# Patient Record
Sex: Female | Born: 1975 | Race: White | Hispanic: No | Marital: Married | Smoking: Current some day smoker
Health system: Southern US, Community
[De-identification: ages and names within clinical notes are randomized; demographics above are authoritative.]

## PROBLEM LIST (undated history)

## (undated) ENCOUNTER — Emergency Department: Admission: EM | Payer: No Typology Code available for payment source

## (undated) DIAGNOSIS — B1920 Unspecified viral hepatitis C without hepatic coma: Secondary | ICD-10-CM

## (undated) DIAGNOSIS — D649 Anemia, unspecified: Secondary | ICD-10-CM

## (undated) DIAGNOSIS — F419 Anxiety disorder, unspecified: Secondary | ICD-10-CM

## (undated) HISTORY — DX: Unspecified viral hepatitis C without hepatic coma: B19.20

## (undated) HISTORY — PX: HYSTERECTOMY ABDOMINAL WITH SALPINGECTOMY: SHX6725

---
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
## 2003-06-24 ENCOUNTER — Other Ambulatory Visit: Payer: Self-pay

## 2004-03-14 ENCOUNTER — Emergency Department: Payer: Self-pay | Admitting: Emergency Medicine

## 2004-03-14 ENCOUNTER — Other Ambulatory Visit: Payer: Self-pay

## 2004-09-25 ENCOUNTER — Observation Stay: Payer: Self-pay | Admitting: Unknown Physician Specialty

## 2004-10-09 ENCOUNTER — Observation Stay: Payer: Self-pay | Admitting: Obstetrics and Gynecology

## 2004-10-10 ENCOUNTER — Observation Stay: Payer: Self-pay

## 2004-10-29 ENCOUNTER — Observation Stay: Payer: Self-pay

## 2004-11-07 ENCOUNTER — Observation Stay: Payer: Self-pay | Admitting: Unknown Physician Specialty

## 2004-11-19 ENCOUNTER — Inpatient Hospital Stay: Payer: Self-pay

## 2005-05-14 ENCOUNTER — Emergency Department: Payer: Self-pay | Admitting: Unknown Physician Specialty

## 2008-08-03 ENCOUNTER — Emergency Department: Payer: Self-pay | Admitting: Emergency Medicine

## 2008-08-10 ENCOUNTER — Ambulatory Visit: Payer: Self-pay | Admitting: Surgery

## 2008-08-19 ENCOUNTER — Ambulatory Visit: Payer: Self-pay | Admitting: Surgery

## 2010-07-11 IMAGING — CT CT ABD-PELV W/O CM
1 of 2 series · 15 of 32 positions shown, 19 images · non-contrast
Comparison: No comparison

REASON FOR EXAM: (1) pain and constipation; (2) pain and constipation
COMMENTS:

PROCEDURE:     CT  - CT ABDOMEN AND PELVIS W[DATE]  [DATE]
RESULT:     Indication: Pain
TECHNIQUE: Multiple axial images from the lung bases to the symphysis pubis
were obtained without oral or intravenous contrast.

[Series 2: abd/pelvis · axial · 0.70mm/px · z∈[-976,-601]mm · 15 of 142 slices shown, 19 images]
[im 11/142  soft-tissue]
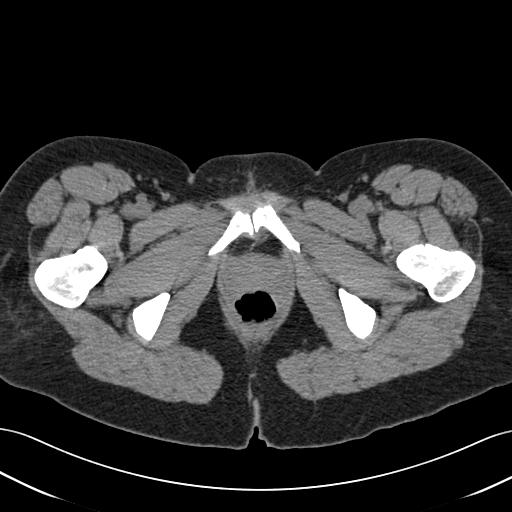
[im 11/142  bone]
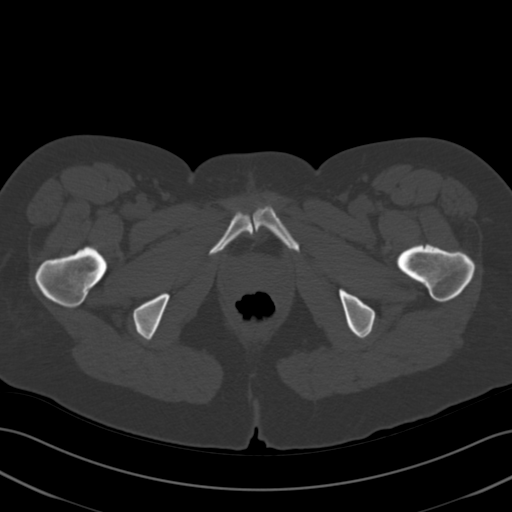
[im 21/142  soft-tissue]
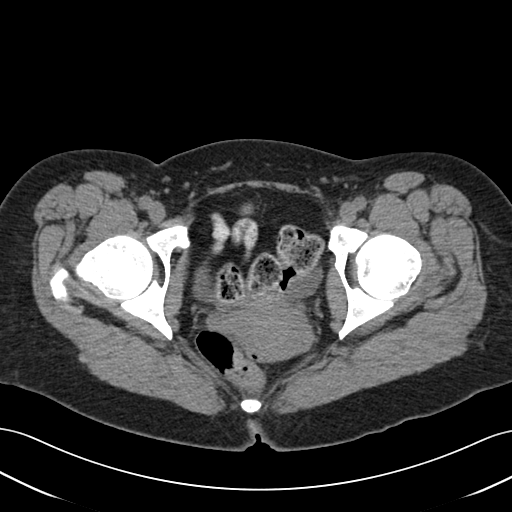
[im 32/142  soft-tissue]
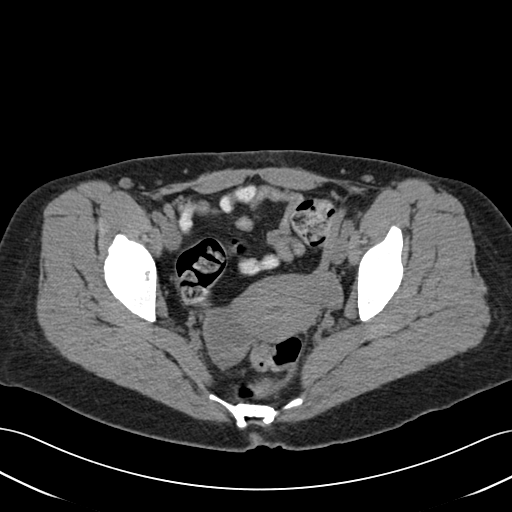
[im 42/142  soft-tissue]
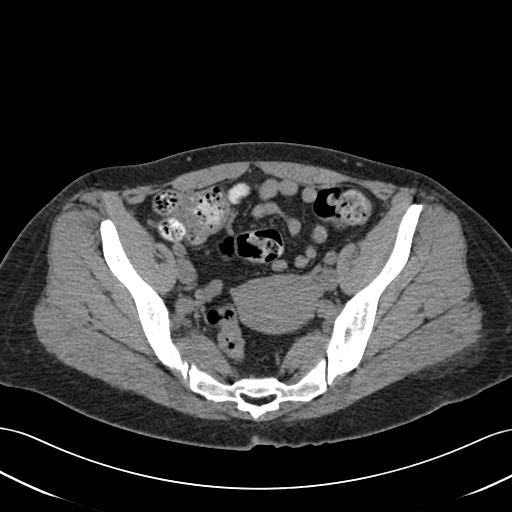
[im 53/142  soft-tissue]
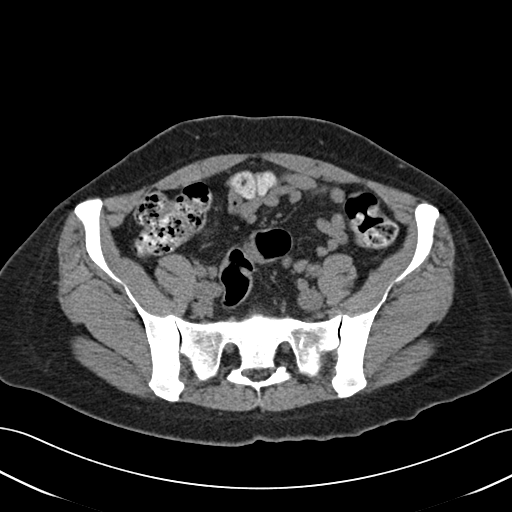
[im 63/142  soft-tissue]
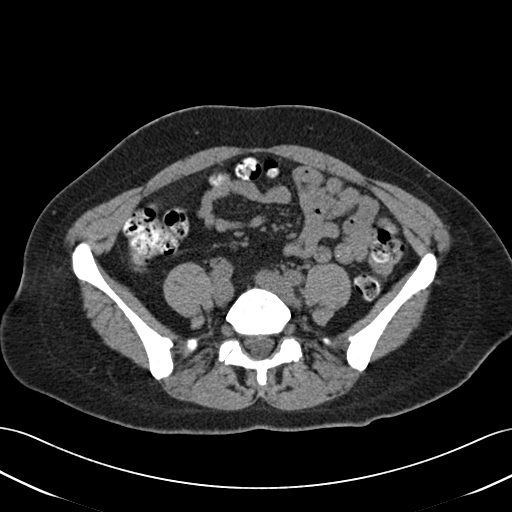
[im 74/142  soft-tissue]
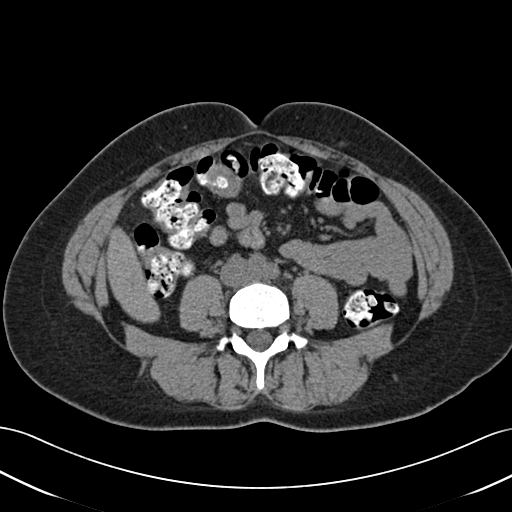
[im 84/142  soft-tissue]
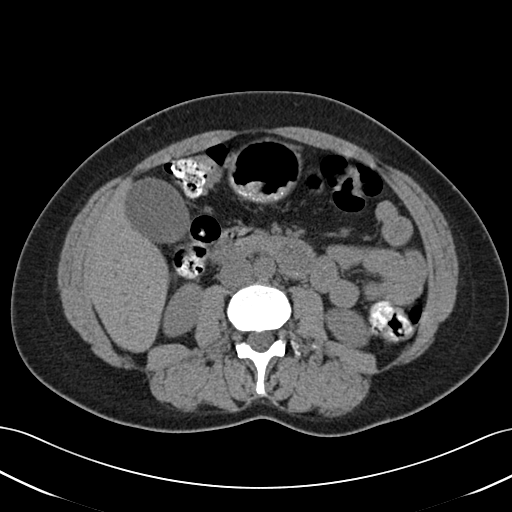
[im 95/142  soft-tissue]
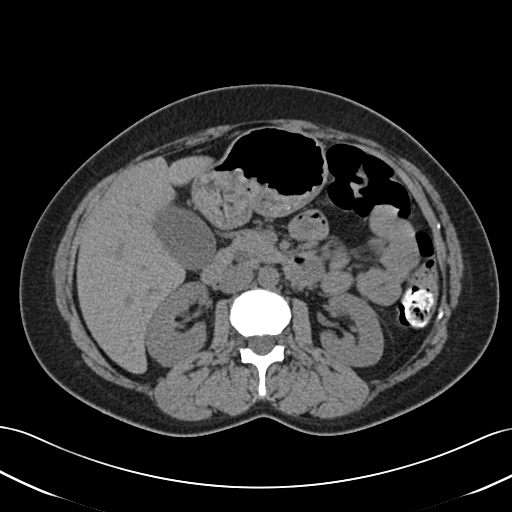
[im 95/142  bone]
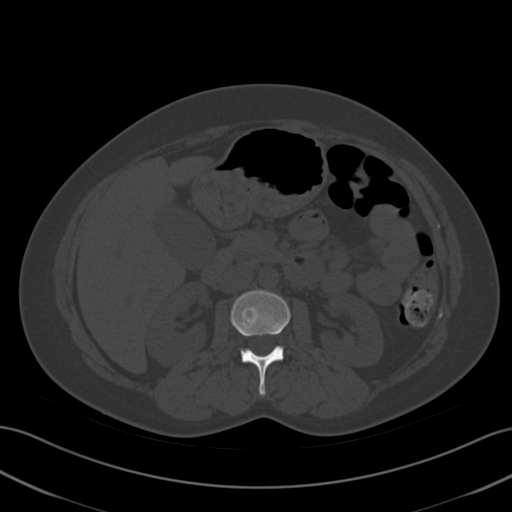
[im 105/142  soft-tissue]
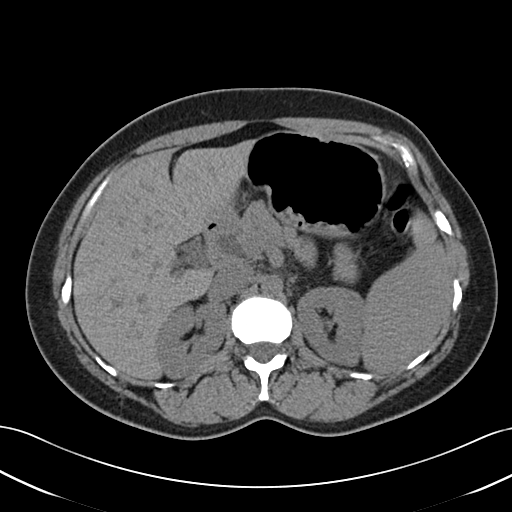
[im 115/142  soft-tissue]
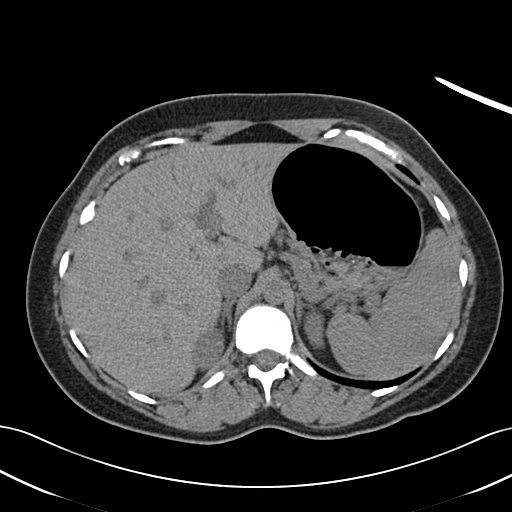
[im 121/142  lung]
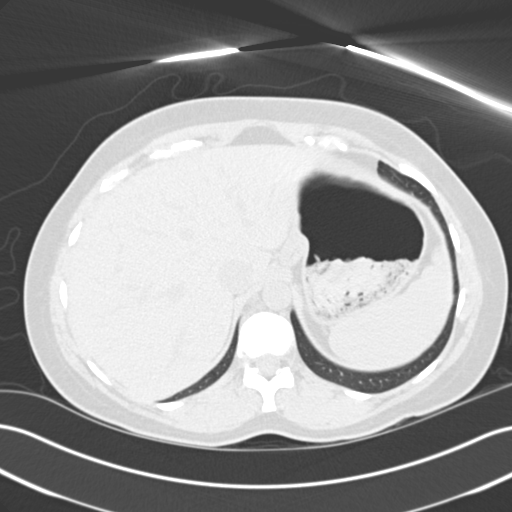
[im 126/142  soft-tissue]
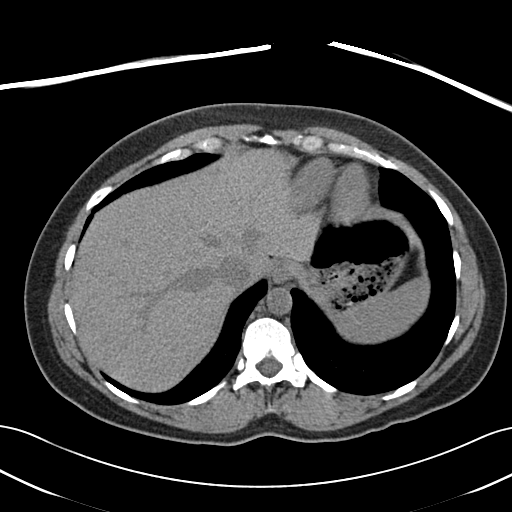
[im 126/142  lung]
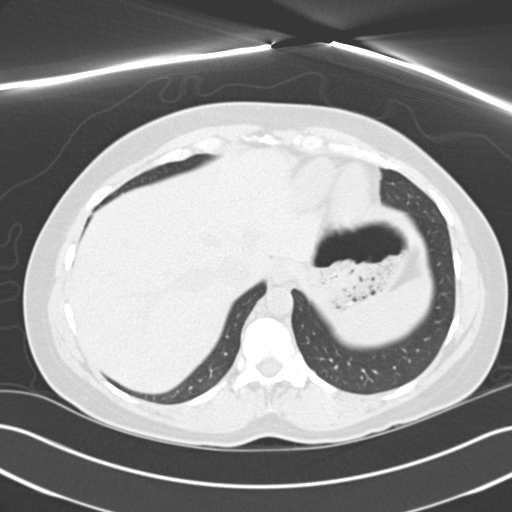
[im 131/142  lung]
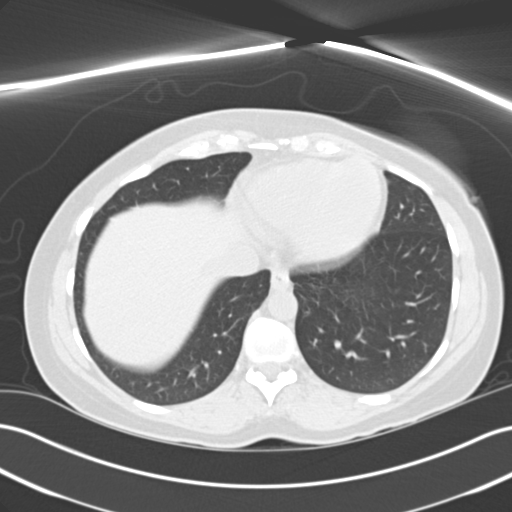
[im 136/142  soft-tissue]
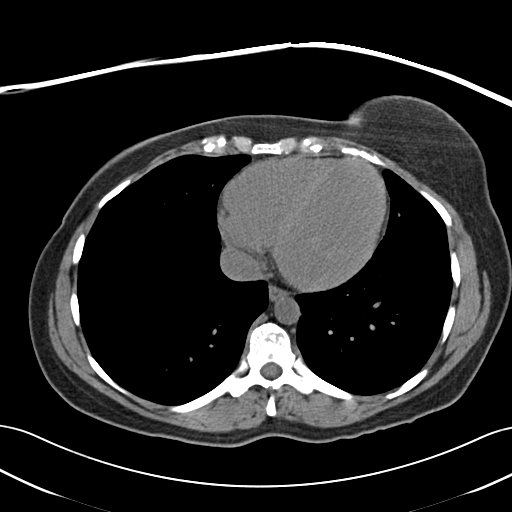
[im 136/142  lung]
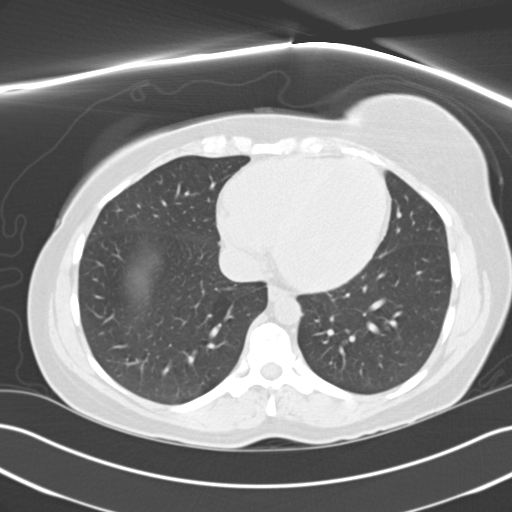

[15 of 32 positions shown; findings below may reference images not displayed]

FINDINGS: The lung bases are clear. There is no pleural or pericardial effusions.

No renal, ureteral, or bladder calculi. No obstructive uropathy. No
perinephric stranding is seen. The kidneys are symmetric in size without
evidence for exophytic mass. The bladder is unremarkable.

The liver demonstrates no focal abnormality. The gallbladder is distended
with possible tiny cholelithiasis present within the gallbladder. There is
mild dilatation of the central intrahepatic biliary ducts. The proximal
common bile duct is mildly dilated measuring 8 mm in greatest diameter. The
spleen demonstrates no focal abnormality. The adrenal glands and pancreas
are normal.

The unopacified stomach, duodenum, small intestine, and large intestine are
unremarkable, but evaluation is limited by lack of oral contrast. There is
no pneumoperitoneum, pneumatosis, or portal venous gas. There is no
abdominal or pelvic free fluid. There is no lymphadenopathy. There is a
nonspecific right ovarian 3.4 cm cystic structure. The left ovary is grossly
unremarkable.

The abdominal aorta is normal in caliber.

The osseous structures are unremarkable.
IMPRESSION: 1. No urolithiasis or obstructive uropathy.

2. There is a nonspecific right ovarian 3.4 cm cystic structure. A followup
pelvic ultrasound is recommended for further characterization.

3. The gallbladder is distended with possible tiny cholelithiasis present
within the gallbladder. There is mild dilatation of the central intrahepatic
biliary ducts. The proximal common bile duct is mildly dilated measuring 8
mm in greatest diameter. If there is further focal concern regarding acute
cholecystitis recommend further evaluation with a right upper quadrant
ultrasound.

## 2010-07-18 IMAGING — US ABDOMEN ULTRASOUND
1 series · 17 of 25 positions shown · non-contrast
Comparison: none

REASON FOR EXAM: RUQ pain
COMMENTS:

[Series 1: abdomen ultrasound · 17 of 83 slices shown]
[im 1/83]
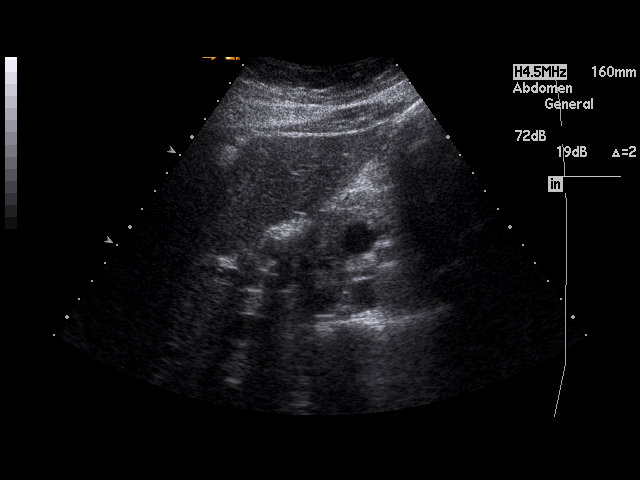
[im 7/83]
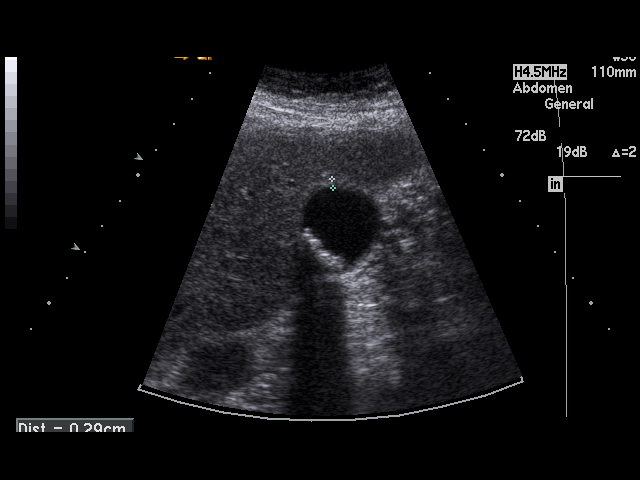
[im 11/83]
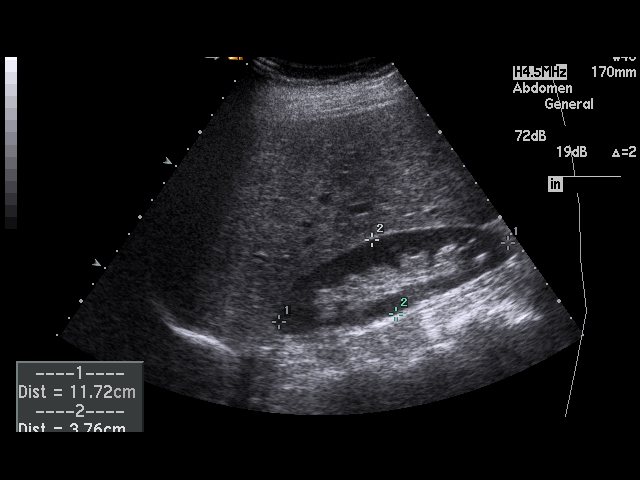
[im 18/83]
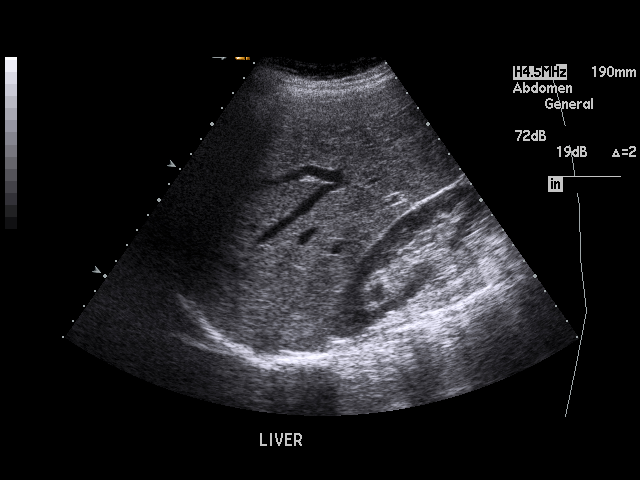
[im 21/83]
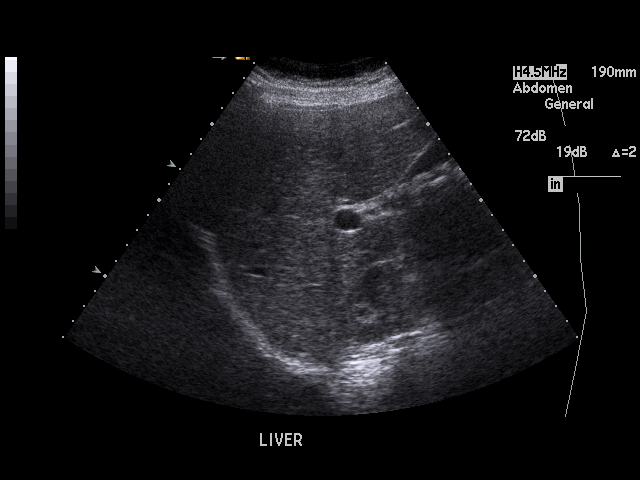
[im 28/83]
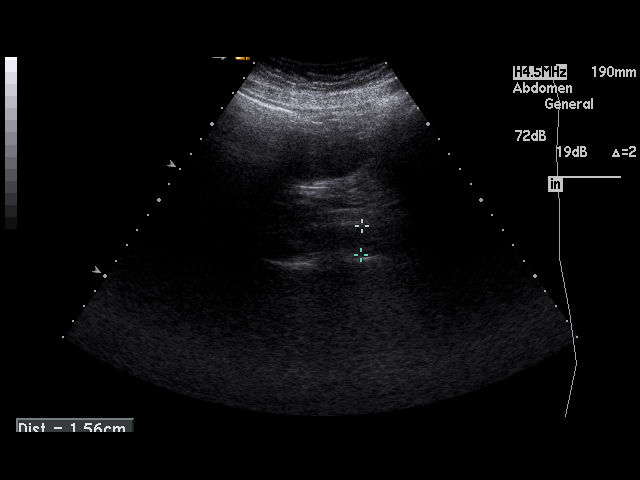
[im 31/83]
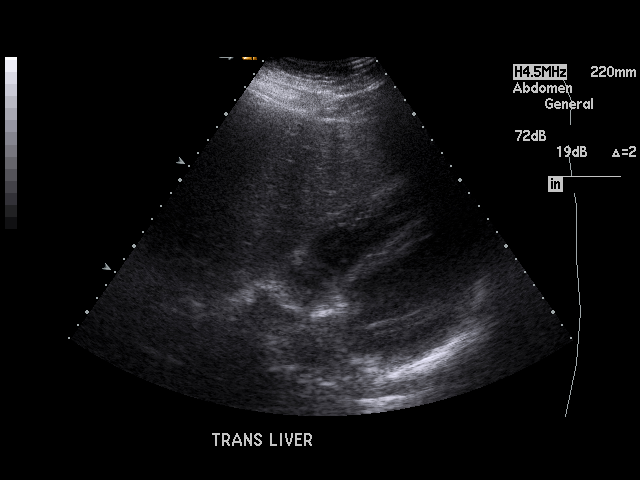
[im 38/83]
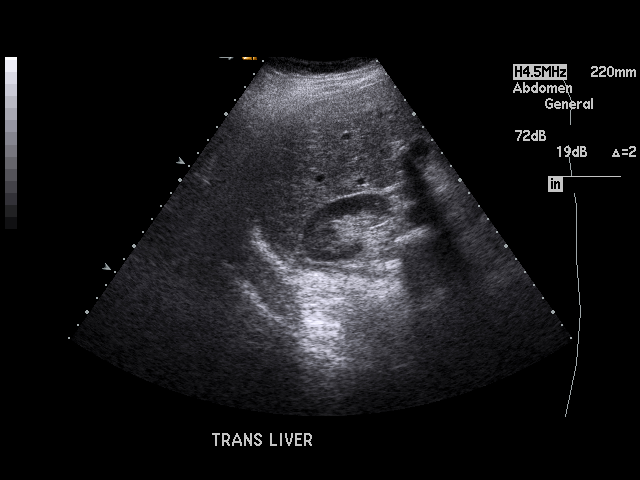
[im 42/83]
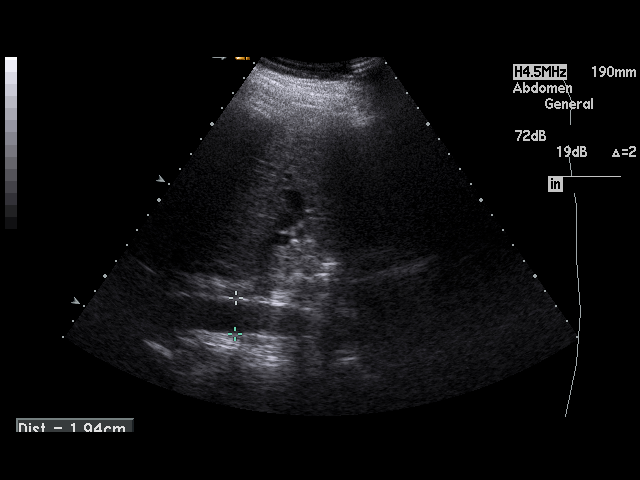
[im 45/83]
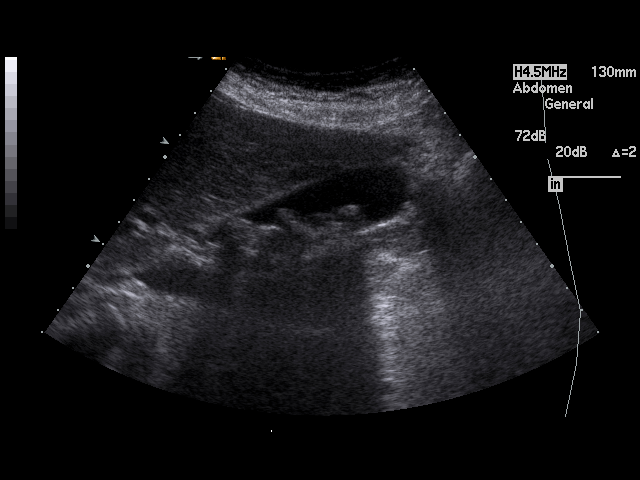
[im 52/83]
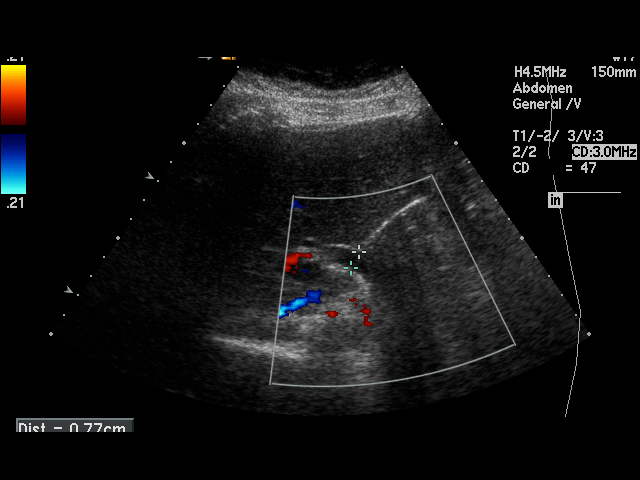
[im 55/83]
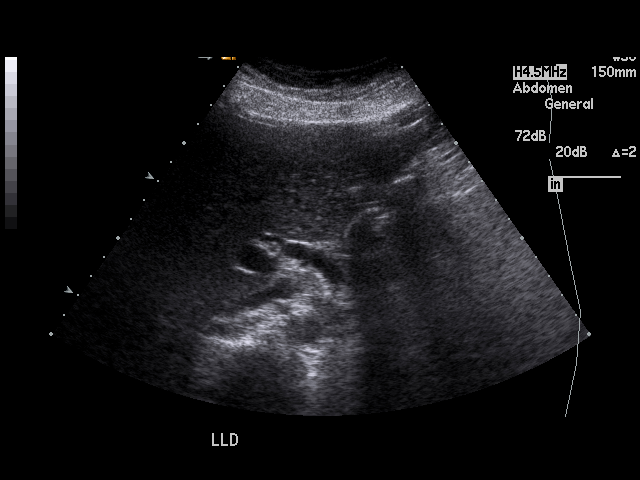
[im 62/83]
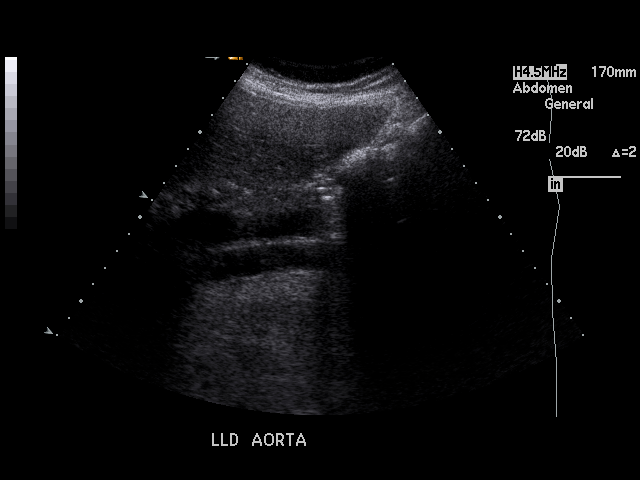
[im 65/83]
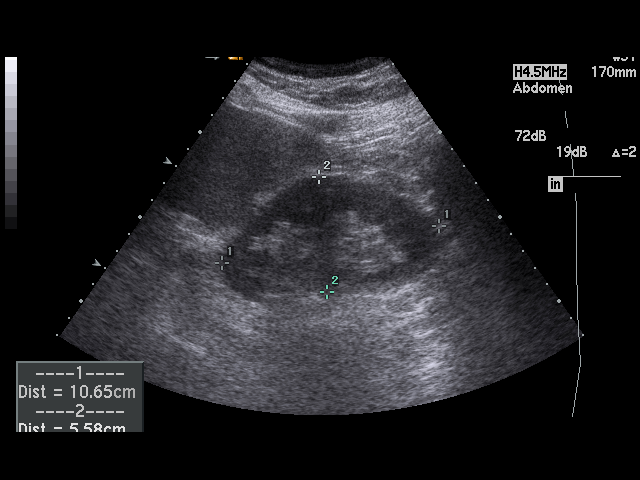
[im 72/83]
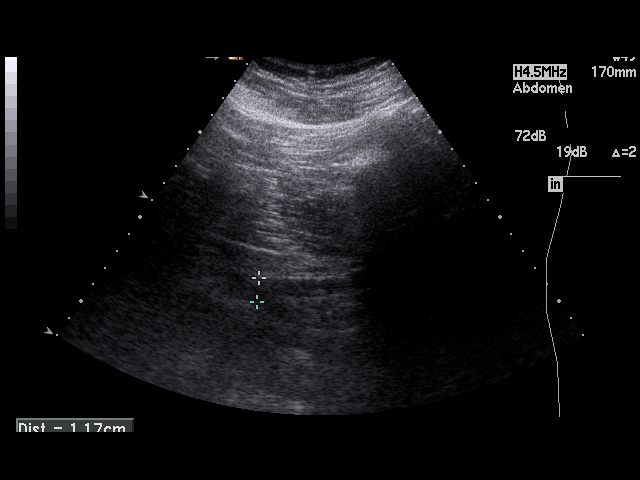
[im 76/83]
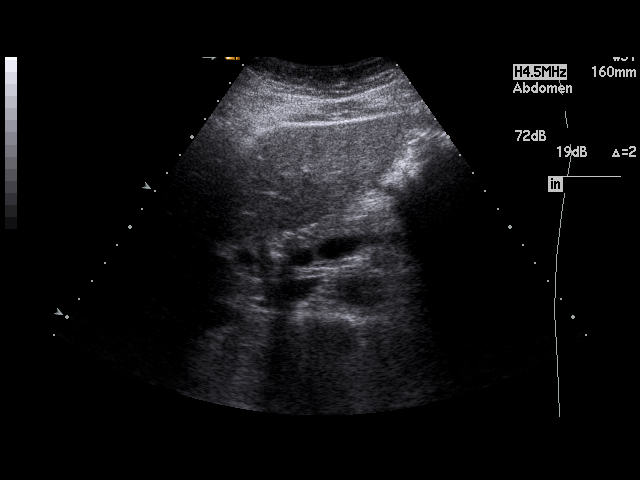
[im 83/83]
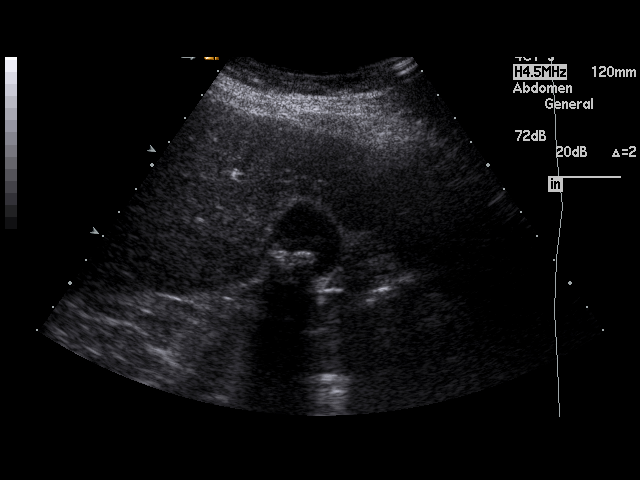

[17 of 25 positions shown; findings below may reference images not displayed]

PROCEDURE:     US  - US ABDOMEN GENERAL SURVEY  - August 10, 2008  [DATE]

RESULT:     The liver, spleen, abdominal aorta and inferior vena cava show
no significant abnormalities. The head and body of the pancreas are normal
in appearance. The pancreatic tail is obscured by bowel gas. There are noted
multiple mobile echodensities in the gallbladder compatible with gallstones.
The gallbladder wall thickness measures 2.9 mm which is upper limits for
normal. The common bile duct measures 8 mm which also is upper limits for
normal or possibly mildly distended for a patient of this age. The
gallbladder could the further evaluated by biliary nuclide scan if
clinically indicated. The kidneys show no hydronephrosis. There is no
ascites.
IMPRESSION: 1. Cholelithiasis.
2. The common bile duct measures 8 mm in diameter which is somewhat
prominent for a patient of this age. The biliary system could be further
evaluated by biliary nuclide scan if clinically indicated.

## 2010-09-26 ENCOUNTER — Emergency Department: Payer: Self-pay

## 2011-01-03 ENCOUNTER — Ambulatory Visit: Payer: Self-pay | Admitting: Internal Medicine

## 2011-01-05 ENCOUNTER — Ambulatory Visit: Payer: Self-pay | Admitting: Internal Medicine

## 2011-01-05 ENCOUNTER — Ambulatory Visit: Payer: Self-pay | Admitting: Oncology

## 2011-02-07 ENCOUNTER — Ambulatory Visit: Payer: Self-pay | Admitting: Internal Medicine

## 2011-02-07 LAB — CBC CANCER CENTER
Basophil %: 0.6 %
Eosinophil %: 2.1 %
HCT: 30.6 % — ABNORMAL LOW (ref 35.0–47.0)
HGB: 9.6 g/dL — ABNORMAL LOW (ref 12.0–16.0)
Lymphocyte #: 2.2 x10 3/mm (ref 1.0–3.6)
MCH: 21.6 pg — ABNORMAL LOW (ref 26.0–34.0)
MCV: 69 fL — ABNORMAL LOW (ref 80–100)
Monocyte #: 0.3 x10 3/mm (ref 0.0–0.7)
Monocyte %: 6.2 %
Neutrophil %: 48.4 %
Platelet: 384 x10 3/mm (ref 150–440)
WBC: 5.2 x10 3/mm (ref 3.6–11.0)

## 2011-02-07 LAB — IRON AND TIBC
Iron Bind.Cap.(Total): 412 ug/dL (ref 250–450)
Iron Saturation: 4 %
Iron: 16 ug/dL — ABNORMAL LOW (ref 50–170)

## 2011-02-07 LAB — RETICULOCYTES
Absolute Retic Count: 0.074 10*6/uL (ref 0.024–0.084)
Reticulocyte: 1.7 % — ABNORMAL HIGH (ref 0.5–1.5)

## 2011-03-08 ENCOUNTER — Ambulatory Visit: Payer: Self-pay | Admitting: Internal Medicine

## 2011-05-21 ENCOUNTER — Ambulatory Visit: Payer: Self-pay | Admitting: Oncology

## 2011-05-21 LAB — CBC CANCER CENTER
Basophil #: 0 x10 3/mm (ref 0.0–0.1)
Basophil %: 0.4 %
Eosinophil #: 0.2 x10 3/mm (ref 0.0–0.7)
Eosinophil %: 3.5 %
HCT: 29.4 % — ABNORMAL LOW (ref 35.0–47.0)
Lymphocyte %: 25.7 %
MCH: 23.5 pg — ABNORMAL LOW (ref 26.0–34.0)
Monocyte #: 0.3 x10 3/mm (ref 0.2–0.9)
Monocyte %: 5.3 %
RBC: 3.93 10*6/uL (ref 3.80–5.20)
RDW: 19 % — ABNORMAL HIGH (ref 11.5–14.5)
WBC: 5.5 x10 3/mm (ref 3.6–11.0)

## 2011-05-21 LAB — IRON AND TIBC
Iron Bind.Cap.(Total): 451 ug/dL — ABNORMAL HIGH (ref 250–450)
Iron: 22 ug/dL — ABNORMAL LOW (ref 50–170)

## 2011-05-21 LAB — RETICULOCYTES: Reticulocyte: 3.3 % — ABNORMAL HIGH (ref 0.5–1.5)

## 2011-05-21 LAB — FERRITIN: Ferritin (ARMC): 3 ng/mL — ABNORMAL LOW (ref 8–388)

## 2011-06-05 ENCOUNTER — Ambulatory Visit: Payer: Self-pay | Admitting: Oncology

## 2011-07-16 ENCOUNTER — Ambulatory Visit: Payer: Self-pay | Admitting: Oncology

## 2011-07-16 LAB — CBC CANCER CENTER
Basophil %: 0.4 %
Eosinophil #: 0.1 x10 3/mm (ref 0.0–0.7)
Eosinophil %: 1.5 %
HCT: 33.8 % — ABNORMAL LOW (ref 35.0–47.0)
HGB: 11 g/dL — ABNORMAL LOW (ref 12.0–16.0)
Lymphocyte #: 2.4 x10 3/mm (ref 1.0–3.6)
Lymphocyte %: 24.7 %
MCH: 26.5 pg (ref 26.0–34.0)
MCV: 82 fL (ref 80–100)
Monocyte %: 3.9 %
WBC: 9.7 x10 3/mm (ref 3.6–11.0)

## 2011-07-16 LAB — IRON AND TIBC
Iron Bind.Cap.(Total): 440 ug/dL (ref 250–450)
Iron Saturation: 14 %
Iron: 62 ug/dL (ref 50–170)
Unbound Iron-Bind.Cap.: 378 ug/dL

## 2011-08-05 ENCOUNTER — Ambulatory Visit: Payer: Self-pay | Admitting: Oncology

## 2011-08-07 ENCOUNTER — Emergency Department: Payer: Self-pay | Admitting: *Deleted

## 2011-08-07 LAB — PREGNANCY, URINE: Pregnancy Test, Urine: NEGATIVE m[IU]/mL

## 2011-08-09 ENCOUNTER — Ambulatory Visit: Payer: Self-pay | Admitting: Oncology

## 2011-11-10 LAB — DRUG SCREEN, URINE
Amphetamines, Ur Screen: NEGATIVE (ref ?–1000)
Benzodiazepine, Ur Scrn: POSITIVE (ref ?–200)
Cannabinoid 50 Ng, Ur ~~LOC~~: NEGATIVE (ref ?–50)
MDMA (Ecstasy)Ur Screen: NEGATIVE (ref ?–500)
Phencyclidine (PCP) Ur S: NEGATIVE (ref ?–25)
Tricyclic, Ur Screen: NEGATIVE (ref ?–1000)

## 2011-11-10 LAB — COMPREHENSIVE METABOLIC PANEL
Albumin: 3.9 g/dL (ref 3.4–5.0)
Alkaline Phosphatase: 71 U/L (ref 50–136)
Anion Gap: 13 (ref 7–16)
Calcium, Total: 8.8 mg/dL (ref 8.5–10.1)
Co2: 22 mmol/L (ref 21–32)
Creatinine: 0.73 mg/dL (ref 0.60–1.30)
EGFR (African American): >60
EGFR (Non-African Amer.): >60
Glucose: 133 mg/dL — ABNORMAL HIGH (ref 65–99)
Osmolality: 283 (ref 275–301)
SGPT (ALT): 65 U/L (ref 12–78)
Sodium: 142 mmol/L (ref 136–145)

## 2011-11-10 LAB — ETHANOL: Ethanol: <3 mg/dL

## 2011-11-10 LAB — URINALYSIS, COMPLETE
Hyaline Cast: 1
Leukocyte Esterase: NEGATIVE
Nitrite: NEGATIVE
Ph: 5 (ref 4.5–8.0)
Protein: 25
RBC,UR: 13 /HPF (ref 0–5)
Specific Gravity: 1.025 (ref 1.003–1.030)
Squamous Epithelial: 3
WBC UR: 1 /HPF (ref 0–5)

## 2011-11-10 LAB — TSH: Thyroid Stimulating Horm: 0.72 u[IU]/mL

## 2011-11-10 LAB — CBC
HCT: 29.8 % — ABNORMAL LOW (ref 35.0–47.0)
HGB: 10 g/dL — ABNORMAL LOW (ref 12.0–16.0)
MCHC: 33.7 g/dL (ref 32.0–36.0)
MCV: 78 fL — ABNORMAL LOW (ref 80–100)
RBC: 3.83 10*6/uL (ref 3.80–5.20)

## 2011-11-10 LAB — SALICYLATE LEVEL: Salicylates, Serum: 3.4 mg/dL — ABNORMAL HIGH

## 2011-11-10 LAB — PREGNANCY, URINE: Pregnancy Test, Urine: NEGATIVE m[IU]/mL

## 2011-11-11 ENCOUNTER — Inpatient Hospital Stay: Payer: Self-pay | Admitting: Psychiatry

## 2011-12-26 ENCOUNTER — Ambulatory Visit: Payer: Self-pay | Admitting: Oncology

## 2011-12-26 LAB — CBC CANCER CENTER
Basophil #: 0 x10 3/mm (ref 0.0–0.1)
Lymphocyte #: 1.7 x10 3/mm (ref 1.0–3.6)
Lymphocyte %: 15.2 %
MCH: 24.1 pg — ABNORMAL LOW (ref 26.0–34.0)
MCV: 79 fL — ABNORMAL LOW (ref 80–100)
Monocyte #: 0.3 x10 3/mm (ref 0.2–0.9)
Monocyte %: 2.7 %
Platelet: 451 x10 3/mm — ABNORMAL HIGH (ref 150–440)
RDW: 16.4 % — ABNORMAL HIGH (ref 11.5–14.5)
WBC: 11.3 x10 3/mm — ABNORMAL HIGH (ref 3.6–11.0)

## 2011-12-26 LAB — IRON AND TIBC
Iron Bind.Cap.(Total): 438 ug/dL (ref 250–450)
Iron: 21 ug/dL — ABNORMAL LOW (ref 50–170)

## 2012-01-05 ENCOUNTER — Ambulatory Visit: Payer: Self-pay | Admitting: Oncology

## 2012-04-01 ENCOUNTER — Ambulatory Visit: Payer: Self-pay | Admitting: Oncology

## 2012-04-16 ENCOUNTER — Ambulatory Visit: Payer: Self-pay | Admitting: Oncology

## 2013-04-06 ENCOUNTER — Ambulatory Visit: Payer: Self-pay | Admitting: Oncology

## 2013-04-12 LAB — CBC CANCER CENTER
Basophil #: 0 x10 3/mm (ref 0.0–0.1)
Basophil %: 0.4 %
EOS PCT: 1.8 %
Eosinophil #: 0.1 x10 3/mm (ref 0.0–0.7)
HCT: 20.6 % — ABNORMAL LOW (ref 35.0–47.0)
HGB: 5.8 g/dL — ABNORMAL LOW (ref 12.0–16.0)
LYMPHS ABS: 2.3 x10 3/mm (ref 1.0–3.6)
Lymphocyte %: 42.7 %
MCH: 18.5 pg — ABNORMAL LOW (ref 26.0–34.0)
MCHC: 28.2 g/dL — ABNORMAL LOW (ref 32.0–36.0)
MCV: 66 fL — ABNORMAL LOW (ref 80–100)
MONOS PCT: 5.6 %
Monocyte #: 0.3 x10 3/mm (ref 0.2–0.9)
NEUTROS ABS: 2.7 x10 3/mm (ref 1.4–6.5)
Neutrophil %: 49.5 %
Platelet: 264 x10 3/mm (ref 150–440)
RBC: 3.14 10*6/uL — ABNORMAL LOW (ref 3.80–5.20)
RDW: 17.8 % — ABNORMAL HIGH (ref 11.5–14.5)
WBC: 5.5 x10 3/mm (ref 3.6–11.0)

## 2013-04-12 LAB — IRON AND TIBC
IRON BIND. CAP.(TOTAL): 425 ug/dL (ref 250–450)
IRON: 11 ug/dL — AB (ref 50–170)
Iron Saturation: 3 %
UNBOUND IRON-BIND. CAP.: 414 ug/dL

## 2013-04-12 LAB — FERRITIN: Ferritin (ARMC): 2 ng/mL — ABNORMAL LOW (ref 8–388)

## 2013-05-05 ENCOUNTER — Ambulatory Visit: Payer: Self-pay | Admitting: Oncology

## 2013-07-01 ENCOUNTER — Ambulatory Visit: Payer: Self-pay | Admitting: Oncology

## 2014-02-14 ENCOUNTER — Emergency Department: Payer: Self-pay | Admitting: Emergency Medicine

## 2014-02-14 LAB — URINALYSIS, COMPLETE
Bacteria: NONE SEEN
Bilirubin,UR: NEGATIVE
Glucose,UR: NEGATIVE mg/dL (ref 0–75)
Ketone: NEGATIVE
LEUKOCYTE ESTERASE: NEGATIVE
NITRITE: NEGATIVE
PROTEIN: NEGATIVE
Ph: 6 (ref 4.5–8.0)
Specific Gravity: 1.006 (ref 1.003–1.030)
Squamous Epithelial: 1

## 2014-02-14 LAB — COMPREHENSIVE METABOLIC PANEL
ALK PHOS: 67 U/L
ALT: 50 U/L
AST: 55 U/L — AB (ref 15–37)
Albumin: 3.5 g/dL (ref 3.4–5.0)
Anion Gap: 9 (ref 7–16)
BUN: 10 mg/dL (ref 7–18)
Bilirubin,Total: 0.3 mg/dL (ref 0.2–1.0)
CO2: 21 mmol/L (ref 21–32)
Calcium, Total: 8.1 mg/dL — ABNORMAL LOW (ref 8.5–10.1)
Chloride: 106 mmol/L (ref 98–107)
Creatinine: 0.93 mg/dL (ref 0.60–1.30)
EGFR (Non-African Amer.): >60
Glucose: 88 mg/dL (ref 65–99)
Osmolality: 270 (ref 275–301)
Potassium: 3.8 mmol/L (ref 3.5–5.1)
Sodium: 136 mmol/L (ref 136–145)
TOTAL PROTEIN: 7.6 g/dL (ref 6.4–8.2)

## 2014-02-14 LAB — DRUG SCREEN, URINE
AMPHETAMINES, UR SCREEN: NEGATIVE (ref ?–1000)
BENZODIAZEPINE, UR SCRN: NEGATIVE (ref ?–200)
Barbiturates, Ur Screen: NEGATIVE (ref ?–200)
CANNABINOID 50 NG, UR ~~LOC~~: NEGATIVE (ref ?–50)
Cocaine Metabolite,Ur ~~LOC~~: POSITIVE (ref ?–300)
MDMA (Ecstasy)Ur Screen: POSITIVE (ref ?–500)
Methadone, Ur Screen: NEGATIVE (ref ?–300)
Opiate, Ur Screen: POSITIVE (ref ?–300)
Phencyclidine (PCP) Ur S: NEGATIVE (ref ?–25)
Tricyclic, Ur Screen: POSITIVE (ref ?–1000)

## 2014-02-14 LAB — CBC
HCT: 30.1 % — AB (ref 35.0–47.0)
HGB: 8.9 g/dL — ABNORMAL LOW (ref 12.0–16.0)
MCH: 21.3 pg — ABNORMAL LOW (ref 26.0–34.0)
MCHC: 29.7 g/dL — ABNORMAL LOW (ref 32.0–36.0)
MCV: 71 fL — ABNORMAL LOW (ref 80–100)
PLATELETS: 273 10*3/uL (ref 150–440)
RBC: 4.2 10*6/uL (ref 3.80–5.20)
RDW: 17.9 % — ABNORMAL HIGH (ref 11.5–14.5)
WBC: 6.8 10*3/uL (ref 3.6–11.0)

## 2014-02-14 LAB — SALICYLATE LEVEL: Salicylates, Serum: <1.7 mg/dL

## 2014-02-14 LAB — ACETAMINOPHEN LEVEL: Acetaminophen: <2 ug/mL

## 2014-02-14 LAB — ETHANOL: Ethanol: <3 mg/dL

## 2014-05-24 NOTE — H&P (Signed)
PATIENT NAME:  Lawernce IonSTALEY, Ambry S MR#:  [id number] DATE OF BIRTH:  Sep 30, 1975  DATE OF ADMISSION:  11/11/2011  REFERRING PHYSICIAN: Dr. Daryel NovemberJonathan Williams  ADMITTING PHYSICIAN: Dr. Braulio ConteJolanta B. Marnae Madani    IDENTIFYING DATA: Ms. Maryclare BeanStaley is a 39 year old female with history of depression and substance abuse.   CHIEF COMPLAINT: "I need to go home."   HISTORY OF PRESENT ILLNESS: Ms. Maryclare BeanStaley has been abusing benzodiazepines and narcotic pain killers in the past several months. She has [facility] Initially he was prescribing both clonazepam for anxiety and Suboxone for chronic pain. With time, however, for reasons unknown he decided to prescribe one or the other. The patient chose to stay on nerve pill. Dr. Rogers BlockerAhluwalia prescribes 1 mg of Klonopin daily. The patient filled her prescription at the very end of the last month. She reportedly has been buying more and more pills from the streets. On the day of admission she became confused, frightened, believed that she was dying and was vomiting "egg yolks. This was in the context of taking unprescribed medications. The patient initially admitted to overdose but was uncertain if it was intentional, now she denies but she overdosed at all intentionally or unintentionally. She is still confused, anxious appearing and slightly bizarre in the Emergency Room. She told me initially that she must return home to take care of her three children. We then learned from the husband that he is on FLME at home perfectly able to take care of the family. The patient denies symptoms of depression, anxiety, or psychosis. Denies symptoms suggestive of bipolar mania. She denies alcohol or illicit substance use.   PAST PSYCHIATRIC HISTORY: None reported. The patient has been frequenting Emergency Room and seeing different doctors to extract benzodiazepines and pain killers. She stayed with Dr. Rogers BlockerAhluwalia until the end of last month taking Suboxone but now is out. She  denies suicide attempt., denies prior hospitalization or substance abuse treatment program participation.   FAMILY PSYCHIATRIC HISTORY: None reported.   PAST MEDICAL HISTORY: None.   ALLERGIES: No known drug allergies.   SOCIAL HISTORY: She is married. She has five children, girls 6218 and 21 are in college. She has three younger children at home currently with her husband. She is unemployed. 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   REVIEW OF SYSTEMS: CONSTITUTIONAL: No fevers or chills. Positive for fatigue. EYES: No double or blurred vision. ENT: No hearing loss. RESPIRATORY: No shortness of breath or cough. CARDIOVASCULAR: No chest pain or orthopnea. GASTROINTESTINAL: No abdominal pain, nausea, vomiting, or diarrhea. GENITOURINARY: No incontinence or frequency. ENDOCRINE: No heat or cold intolerance. LYMPHATIC: No anemia or easy bruising. INTEGUMENTARY: No acne or rash. MUSCULOSKELETAL: No muscle or joint pain. NEUROLOGIC: No tingling or weakness. PSYCHIATRIC: See history of present illness for details.   PHYSICAL EXAMINATION:  VITAL SIGNS: Blood pressure 139/74, pulse 90, respirations 20, temperature 97.8.   GENERAL: This is a well-developed female in no acute distress. The pupils are equal, round, and reactive to light. Sclerae anicteric.   NECK: Supple. No thyromegaly.   LUNGS: Clear to auscultation. No dullness to percussion.   HEART: Regular rhythm and rate. No murmurs, rubs, or gallops.   ABDOMEN: Soft, nontender, nondistended. Positive bowel sounds.   MUSCULOSKELETAL: Normal muscle strength in all extremities.   SKIN: No rashes or bruises.   LYMPHATIC: No cervical adenopathy.   NEUROLOGIC: Cranial nerves II through XII are intact.   LABORATORY, DIAGNOSTIC AND RADIOLOGICAL DATA: Chemistries are within normal limits except  for blood glucose of 133. Blood alcohol level zero. LFTs within normal limits except for AST of 269. TSH 0.72. Urine tox screen positive for benzodiazepines and opiates.  CBC within normal limits. Urinalysis is not suggestive of urinary tract infection. Serum acetaminophen and salicylates are low. Pregnancy test urine is negative.   MENTAL STATUS EXAMINATION ON ADMISSION: The patient is alert and oriented to person, place, time, and situation. She is rather fidgety, restless, moving about all the time. Her legs are super active. She is pleasant, polite and tries to be cooperative. She is in bed wearing hospital scrubs and a yellow shirt. She maintains some eye contact. Speech is soft. Mood is depressed with anxious affect. Thought processing is logical with its own logic. Thought content: The patient denies suicidal or homicidal ideation. There are no delusions or paranoia. There are no auditory or visual hallucinations. Her cognition is grossly intact. She registers three out of three and recalls three out of three objects after five minutes. She can spell world forward and backward. She knows current president. Her insight and judgment are questionable.   SUICIDE RISK ASSESSMENT ON ADMISSION: This is a patient with long history of prescription pill abuse who came to the hospital asking for help.   DIAGNOSIS:  AXIS I:  1. Benzodiazepine dependence.  2. Rule out benzodiazepine withdrawal delirium. 3. Narcotic painkiller dependence.   AXIS II: Deferred.   AXIS III: None.   AXIS IV: Mental illness, substance abuse, marital conflict.   AXIS V: GAF on admission 25.   PLAN: The patient was admitted to [facility] Medicine unit for safety, stabilization and medication management. She was initially placed on suicide precautions and was closely monitored for any unsafe behaviors. She underwent full psychiatric and risk assessment. She received pharmacotherapy, individual and group psychotherapy, substance abuse counseling, and support from therapeutic milieu.  1. Suicidal ideation. The patient denies. 2. Substance abuse. She will be  placed on benzodiazepine taper and symptomatically treated for symptoms of opioid withdrawal.   3. Follow up. The patient at this point is not interested in treatment but will make the referral to [facility]. Will continue to negotiate.  4. Disposition. To be established.  ____________________________ Ellin Goodie. Tad Fancher, MD jbp:cms D: 11/11/2011 18:08:05 ET T: 11/12/2011 06:11:07 ET JOB#: [id number]  cc: Darlynn Ricco B. Jennet Maduro, MD, <Dictator> Shari Prows MD ELECTRONICALLY SIGNED 11/13/2011 6:15

## 2014-05-24 NOTE — Consult Note (Signed)
Brief Consult Note: Diagnosis: benzodiazepine dependence, opioid dependence.   Patient was seen by consultant.   Consult note dictated.   Recommend further assessment or treatment.   Orders entered.   Discussed with Attending MD.   Comments: Ms. Michelle Garza has been abusing benzodiazepines and narcotics. She will be admitted to psychiatry for detox.  Electronic Signatures: Kristine LineaPucilowska, Novice Vrba (MD)  (Signed 07-Oct-13 15:41)  Authored: Brief Consult Note   Last Updated: 07-Oct-13 15:41 by Kristine LineaPucilowska, Cherri Yera (MD)

## 2015-02-05 HISTORY — PX: ABDOMINAL HYSTERECTOMY: SHX81

## 2018-02-04 DIAGNOSIS — S069XAA Unspecified intracranial injury with loss of consciousness status unknown, initial encounter: Secondary | ICD-10-CM

## 2018-02-04 HISTORY — DX: Unspecified intracranial injury with loss of consciousness status unknown, initial encounter: S06.9XAA

## 2018-05-22 ENCOUNTER — Encounter: Payer: Self-pay | Admitting: Emergency Medicine

## 2018-05-22 ENCOUNTER — Emergency Department
Admission: EM | Admit: 2018-05-22 | Discharge: 2018-05-22 | Disposition: A | Discharge status: Home / Self Care | Payer: Self-pay | Attending: Student in an Organized Health Care Education/Training Program | Admitting: Student in an Organized Health Care Education/Training Program

## 2018-05-22 ENCOUNTER — Other Ambulatory Visit: Payer: Self-pay

## 2018-05-22 DIAGNOSIS — F1210 Cannabis abuse, uncomplicated: Secondary | ICD-10-CM | POA: Insufficient documentation

## 2018-05-22 DIAGNOSIS — T50901A Poisoning by unspecified drugs, medicaments and biological substances, accidental (unintentional), initial encounter: Secondary | ICD-10-CM

## 2018-05-22 DIAGNOSIS — F1410 Cocaine abuse, uncomplicated: Secondary | ICD-10-CM | POA: Insufficient documentation

## 2018-05-22 DIAGNOSIS — F17200 Nicotine dependence, unspecified, uncomplicated: Secondary | ICD-10-CM | POA: Insufficient documentation

## 2018-05-22 DIAGNOSIS — F1910 Other psychoactive substance abuse, uncomplicated: Secondary | ICD-10-CM

## 2018-05-22 HISTORY — DX: Anemia, unspecified: D64.9

## 2018-05-22 LAB — URINALYSIS, ROUTINE W REFLEX MICROSCOPIC
Bacteria, UA: NONE SEEN
Bilirubin Urine: NEGATIVE
Glucose, UA: NEGATIVE mg/dL
Ketones, ur: 5 mg/dL — AB
Leukocytes,Ua: NEGATIVE
Nitrite: NEGATIVE
Protein, ur: NEGATIVE mg/dL
Specific Gravity, Urine: 1.017 (ref 1.005–1.030)
pH: 6 (ref 5.0–8.0)

## 2018-05-22 LAB — CBC WITH DIFFERENTIAL/PLATELET
Abs Immature Granulocytes: 0.02 10*3/uL (ref 0.00–0.07)
Basophils Absolute: 0 10*3/uL (ref 0.0–0.1)
Basophils Relative: 0 %
Eosinophils Absolute: 0 10*3/uL (ref 0.0–0.5)
Eosinophils Relative: 0 %
HCT: 44 % (ref 36.0–46.0)
Hemoglobin: 15.2 g/dL — ABNORMAL HIGH (ref 12.0–15.0)
Immature Granulocytes: 0 %
Lymphocytes Relative: 22 %
Lymphs Abs: 1.7 10*3/uL (ref 0.7–4.0)
MCH: 31 pg (ref 26.0–34.0)
MCHC: 34.5 g/dL (ref 30.0–36.0)
MCV: 89.6 fL (ref 80.0–100.0)
Monocytes Absolute: 0.3 10*3/uL (ref 0.1–1.0)
Monocytes Relative: 4 %
Neutro Abs: 5.7 10*3/uL (ref 1.7–7.7)
Neutrophils Relative %: 74 %
Platelets: 240 10*3/uL (ref 150–400)
RBC: 4.91 MIL/uL (ref 3.87–5.11)
RDW: 11.7 % (ref 11.5–15.5)
WBC: 7.8 10*3/uL (ref 4.0–10.5)
nRBC: 0 % (ref 0.0–0.2)

## 2018-05-22 LAB — BASIC METABOLIC PANEL
Anion gap: 12 (ref 5–15)
BUN: 15 mg/dL (ref 6–20)
CO2: 26 mmol/L (ref 22–32)
Calcium: 10.2 mg/dL (ref 8.9–10.3)
Chloride: 98 mmol/L (ref 98–111)
Creatinine, Ser: 0.89 mg/dL (ref 0.44–1.00)
GFR calc Af Amer: >60 mL/min (ref >60)
GFR calc non Af Amer: >60 mL/min (ref >60)
Glucose, Bld: 121 mg/dL — ABNORMAL HIGH (ref 70–99)
Potassium: 3.8 mmol/L (ref 3.5–5.1)
Sodium: 136 mmol/L (ref 135–145)

## 2018-05-22 LAB — POCT PREGNANCY, URINE: Preg Test, Ur: NEGATIVE

## 2018-05-22 LAB — URINE DRUG SCREEN, QUALITATIVE (ARMC ONLY)
Amphetamines, Ur Screen: NOT DETECTED
Barbiturates, Ur Screen: NOT DETECTED
Benzodiazepine, Ur Scrn: POSITIVE — AB
Cannabinoid 50 Ng, Ur ~~LOC~~: POSITIVE — AB
Cocaine Metabolite,Ur ~~LOC~~: POSITIVE — AB
MDMA (Ecstasy)Ur Screen: NOT DETECTED
Methadone Scn, Ur: NOT DETECTED
Opiate, Ur Screen: NOT DETECTED
Phencyclidine (PCP) Ur S: NOT DETECTED
Tricyclic, Ur Screen: NOT DETECTED

## 2018-05-22 LAB — TROPONIN I: Troponin I: <0.03 ng/mL (ref <0.03)

## 2018-05-22 LAB — ETHANOL: Alcohol, Ethyl (B): <10 mg/dL (ref <10)

## 2018-05-22 NOTE — ED Provider Notes (Signed)
Patient reassessed.  Awake and alert.  Patient was able to tolerate PO and was able to ambulate with a steady gait. Stable appropriate for discharge home.   Willy Eddy, MD 05/22/18 [id number]

## 2018-05-22 NOTE — ED Provider Notes (Addendum)
[facility] Emergency Department Provider Note  ____________________________________________   First MD Initiated Contact with Patient 05/22/18 [phone]     (approximate)  I have reviewed the triage vital signs and the nursing notes.   HISTORY  Chief Complaint Drug Overdose  Level 5 caveat:  history/ROS limited by acute intoxication  HPI Michelle Garza is a 43 y.o. female with no reported medical history who presents by EMS for evaluation of accidental overdose.  The patient is very somnolent but awakens to loud voice and painful stimuli.  She admits to smoking crack, marijuana, and taking "one" Xanax earlier today.  She also reports that she thinks she she took a shot of Christiane Ha.  She denies any other drug use including IV drugs, heroin, fentanyl, any other opioids, etc.  She says "I did something stupid" but adamantly shakes her head no when asked if she was trying to kill herself.  Reportedly on the scene when EMS was called out she initially complained of chest pain but no longer says that she is having any pain.  She denies any recent illness and denies shortness of breath and abdominal pain.        Past Medical History:  Diagnosis Date  . Chronic anemia    secondary to menorrhagia, which prompted hysterectomy in 2017    There are no active problems to display for this patient.   Past Surgical History:  Procedure Laterality Date  . ABDOMINAL HYSTERECTOMY  2017   with bilateral salpingectomy    Prior to Admission medications   Not on File    Allergies Patient has no known allergies.  History reviewed. No pertinent family history.  Social History Social History   Tobacco Use  . Smoking status: Current Every Day Smoker  . Smokeless tobacco: Never Used  Substance Use Topics  . Alcohol use: Yes  . Drug use: Yes    Types: Cocaine, Marijuana    Review of Systems Level 5 caveat:  history/ROS limited by acute intoxication  ____________________________________________   PHYSICAL EXAM:  VITAL SIGNS: ED Triage Vitals  Enc Vitals Group     BP 05/22/18 0527 127/80     Pulse Rate 05/22/18 0527 61     Resp 05/22/18 0527 14     Temp 05/22/18 0527 97.7 F (36.5 C)     Temp Source 05/22/18 0527 Oral     SpO2 05/22/18 0527 98 %     Weight 05/22/18 0525 63.5 kg (140 lb)     Height 05/22/18 0525 1.626 m ( )     Head Circumference --      Peak Flow --      Pain Score 05/22/18 0524 0     Pain Loc --      Pain Edu? --      Excl. in GC? --     Constitutional: Somnolent but awakens to loud voice and painful stimuli.  No acute distress. Eyes: Conjunctivae are normal.  Pupils are dilated bilaterally and sluggish to respond but are equal. Head: Atraumatic. Nose: No congestion/rhinnorhea. Mouth/Throat: Mucous membranes are moist. Neck: No stridor.  No meningeal signs.   Cardiovascular: Normal rate, regular rhythm. Good peripheral circulation. Grossly normal heart sounds. Respiratory: Normal respiratory effort.  No retractions. No audible wheezing. Gastrointestinal: Soft and nontender. No distention.  Musculoskeletal: No lower extremity tenderness nor edema. No gross deformities of extremities. Neurologic: Slurred speech and slow language. No gross focal neurologic deficits are appreciated.  Skin:  Skin is  warm, dry and intact. No rash noted.   ____________________________________________   LABS (all labs ordered are listed, but only abnormal results are displayed)  Labs Reviewed  CBC WITH DIFFERENTIAL/PLATELET - Abnormal; Notable for the following components:      Result Value   Hemoglobin 15.2 (*)    All other components within normal limits  BASIC METABOLIC PANEL - Abnormal; Notable for the following components:   Glucose, Bld 121 (*)    All other components within normal limits  URINALYSIS, ROUTINE W REFLEX MICROSCOPIC - Abnormal; Notable for the following components:   Color, Urine YELLOW (*)     APPearance HAZY (*)    Hgb urine dipstick SMALL (*)    Ketones, ur 5 (*)    All other components within normal limits  URINE DRUG SCREEN, QUALITATIVE ([facility] ONLY) - Abnormal; Notable for the following components:   Cocaine Metabolite,Ur [place] POSITIVE (*)    Cannabinoid 50 Ng, Ur  POSITIVE (*)    Benzodiazepine, Ur Scrn POSITIVE (*)    All other components within normal limits  TROPONIN I  ETHANOL  POCT PREGNANCY, URINE   ____________________________________________  EKG  ED ECG REPORT I, Loleta Rose, the attending physician, personally viewed and interpreted this ECG.  Date: 05/22/2018 EKG Time: 5:25 AM Rate: 64 Rhythm: normal sinus rhythm QRS Axis: normal Intervals: normal ST/T Wave abnormalities: normal Narrative Interpretation: no evidence of acute ischemia  ____________________________________________  RADIOLOGY   ED MD interpretation:  No indication for imaging  Official radiology report(s): No results found.  ____________________________________________   PROCEDURES   Procedure(s) performed (including Critical Care):  Procedures   ____________________________________________   INITIAL IMPRESSION / MDM / ASSESSMENT AND PLAN / ED COURSE  As part of my medical decision making, I reviewed the following data within the electronic MEDICAL RECORD NUMBER Nursing notes reviewed and incorporated, Labs reviewed , EKG interpreted , Old chart reviewed, Notes from prior ED visits and [place] Controlled Substance Database  Michelle Garza was evaluated in Emergency Department on 05/22/2018 for the symptoms described in the history of present illness. She was evaluated in the context of the global COVID-19 pandemic, which necessitated consideration that the patient might be at risk for infection with the SARS-CoV-2 virus that causes COVID-19. Institutional protocols and algorithms that pertain to the evaluation of patients at risk for COVID-19 are in a state of rapid change  based on information released by regulatory bodies including the CDC and federal and state organizations. These policies and algorithms were followed during the patient's care in the ED.      Differential diagnosis includes, but is not limited to, accidental drug/polysubstance overdose, intentional overdose, medication side effect, alcohol intoxication, less likely metabolic abnormality or acute infectious process.  There is nothing about the presentation to make me concerned about COVID-19.  Her vital signs are stable, she is afebrile, she is not hypoxemic, and she is protecting her airway at this time.  She is on the cardiac monitor and pulse oximeter.  I have ordered basic labs because of the report of chest pain and the fact that she admits to smoking crack, although I think Prinzmetal angina or any sort of ACS is very unlikely.  I anticipate monitoring her for a few hours until clinical sobriety.  She does not meet criteria for involuntary commitment and may leave voluntarily once she is clinically sober and medically cleared.  Clinical Course as of May 22 639  Fri May 22, 2018  0638 Urine drug screen  reveals results that are consistent with the history she provided, notably of taking Xanax, marijuana, and cocaine.  Of note, opiates are negative, but synthetic such as fentanyl are unlikely to show up.  Urine Drug Screen, Qualitative ([facility] only)(!) [CF]  [id number] Lab work is all reassuring and within normal limits with no acute abnormalities, no evidence of renal dysfunction, normal anion gap, no leukocytosis, etc.  Troponin is negative.   [CF]  [phone] Transferring ED care to Dr. Roxan Hockeyobinson to reassess for clinical sobriety.   [CF]  0640 Alcohol, Ethyl (B): <10 [CF]    Clinical Course User Index [CF] Loleta RoseForbach, Russell Quinney, MD    ____________________________________________  FINAL CLINICAL IMPRESSION(S) / ED DIAGNOSES  Final diagnoses:  Polysubstance abuse (HCC)  Accidental overdose, initial encounter      MEDICATIONS GIVEN DURING THIS VISIT:  Medications - No data to display   ED Discharge Orders    None       Note:  This document was prepared using Dragon voice recognition software and may include unintentional dictation errors.   Loleta RoseForbach, Clessie Karras, MD 05/22/18 [id number]    Loleta RoseForbach, Kandas Oliveto, MD 05/22/18 [phone]
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
## 2018-05-22 NOTE — Discharge Instructions (Signed)
You have been seen in the Emergency Department (ED) today for substance abuse.  You have been evaluated by the ED physician(s) and/or by the behavioral medicine specialists and are being discharged with outpatient resources and follow up recommendations.  Please return to the ED immediately if you have ANY thoughts of hurting yourself or anyone else, so that we may help you.  Please avoid alcohol and drug use.  Follow up with your doctor and/or therapist as soon as possible regarding today's ED  visit.   Please follow up any other recommendations and clinic appointments provided by the psychiatry team that saw you in the Emergency Department. 

## 2018-05-22 NOTE — ED Notes (Signed)
Patient AAOx4. Vitals stable. NAD. 

## 2018-06-19 LAB — HM HIV SCREENING LAB: HM HIV Screening: NEGATIVE

## 2018-08-21 ENCOUNTER — Ambulatory Visit: Payer: Self-pay

## 2018-11-29 ENCOUNTER — Encounter (HOSPITAL_COMMUNITY): Payer: Self-pay

## 2018-11-29 ENCOUNTER — Inpatient Hospital Stay (HOSPITAL_COMMUNITY)
Admission: EM | Admit: 2018-11-29 | Discharge: 2018-12-04 | DRG: 964 | Disposition: A | Discharge status: Home / Self Care | Payer: Self-pay | Attending: Surgery | Admitting: Surgery

## 2018-11-29 ENCOUNTER — Emergency Department (HOSPITAL_COMMUNITY): Payer: Self-pay

## 2018-11-29 ENCOUNTER — Other Ambulatory Visit: Payer: Self-pay

## 2018-11-29 DIAGNOSIS — R519 Headache, unspecified: Secondary | ICD-10-CM | POA: Diagnosis not present

## 2018-11-29 DIAGNOSIS — F17200 Nicotine dependence, unspecified, uncomplicated: Secondary | ICD-10-CM | POA: Diagnosis present

## 2018-11-29 DIAGNOSIS — H4902 Third [oculomotor] nerve palsy, left eye: Secondary | ICD-10-CM

## 2018-11-29 DIAGNOSIS — I959 Hypotension, unspecified: Secondary | ICD-10-CM | POA: Diagnosis present

## 2018-11-29 DIAGNOSIS — S065X9A Traumatic subdural hemorrhage with loss of consciousness of unspecified duration, initial encounter: Secondary | ICD-10-CM | POA: Diagnosis present

## 2018-11-29 DIAGNOSIS — S2220XA Unspecified fracture of sternum, initial encounter for closed fracture: Secondary | ICD-10-CM

## 2018-11-29 DIAGNOSIS — S36115A Moderate laceration of liver, initial encounter: Secondary | ICD-10-CM | POA: Diagnosis present

## 2018-11-29 DIAGNOSIS — S0240DA Maxillary fracture, left side, initial encounter for closed fracture: Secondary | ICD-10-CM | POA: Diagnosis present

## 2018-11-29 DIAGNOSIS — S36031A Moderate laceration of spleen, initial encounter: Secondary | ICD-10-CM | POA: Diagnosis present

## 2018-11-29 DIAGNOSIS — S0003XA Contusion of scalp, initial encounter: Secondary | ICD-10-CM | POA: Diagnosis present

## 2018-11-29 DIAGNOSIS — S0990XA Unspecified injury of head, initial encounter: Secondary | ICD-10-CM

## 2018-11-29 DIAGNOSIS — Y92410 Unspecified street and highway as the place of occurrence of the external cause: Secondary | ICD-10-CM

## 2018-11-29 DIAGNOSIS — S36039A Unspecified laceration of spleen, initial encounter: Secondary | ICD-10-CM

## 2018-11-29 DIAGNOSIS — Z20828 Contact with and (suspected) exposure to other viral communicable diseases: Secondary | ICD-10-CM | POA: Diagnosis present

## 2018-11-29 DIAGNOSIS — Z90710 Acquired absence of both cervix and uterus: Secondary | ICD-10-CM | POA: Diagnosis not present

## 2018-11-29 LAB — CBC
HCT: 38.1 % (ref 36.0–46.0)
Hemoglobin: 13.2 g/dL (ref 12.0–15.0)
MCH: 31.4 pg (ref 26.0–34.0)
MCHC: 34.6 g/dL (ref 30.0–36.0)
MCV: 90.5 fL (ref 80.0–100.0)
Platelets: 241 10*3/uL (ref 150–400)
RBC: 4.21 MIL/uL (ref 3.87–5.11)
RDW: 12.1 % (ref 11.5–15.5)
WBC: 7.5 10*3/uL (ref 4.0–10.5)
nRBC: 0 % (ref 0.0–0.2)

## 2018-11-29 LAB — COMPREHENSIVE METABOLIC PANEL
ALT: 35 U/L (ref 0–44)
AST: 54 U/L — ABNORMAL HIGH (ref 15–41)
Albumin: 3.4 g/dL — ABNORMAL LOW (ref 3.5–5.0)
Alkaline Phosphatase: 44 U/L (ref 38–126)
Anion gap: 8 (ref 5–15)
BUN: 14 mg/dL (ref 6–20)
CO2: 24 mmol/L (ref 22–32)
Calcium: 8.7 mg/dL — ABNORMAL LOW (ref 8.9–10.3)
Chloride: 108 mmol/L (ref 98–111)
Creatinine, Ser: 0.84 mg/dL (ref 0.44–1.00)
GFR calc Af Amer: >60 mL/min (ref >60)
GFR calc non Af Amer: >60 mL/min (ref >60)
Glucose, Bld: 111 mg/dL — ABNORMAL HIGH (ref 70–99)
Potassium: 3.5 mmol/L (ref 3.5–5.1)
Sodium: 140 mmol/L (ref 135–145)
Total Bilirubin: 0.3 mg/dL (ref 0.3–1.2)
Total Protein: 6.2 g/dL — ABNORMAL LOW (ref 6.5–8.1)

## 2018-11-29 LAB — I-STAT CHEM 8, ED
BUN: 16 mg/dL (ref 6–20)
Calcium, Ion: 1.18 mmol/L (ref 1.15–1.40)
Chloride: 105 mmol/L (ref 98–111)
Creatinine, Ser: 0.8 mg/dL (ref 0.44–1.00)
Glucose, Bld: 105 mg/dL — ABNORMAL HIGH (ref 70–99)
HCT: 38 % (ref 36.0–46.0)
Hemoglobin: 12.9 g/dL (ref 12.0–15.0)
Potassium: 3.4 mmol/L — ABNORMAL LOW (ref 3.5–5.1)
Sodium: 143 mmol/L (ref 135–145)
TCO2: 26 mmol/L (ref 22–32)

## 2018-11-29 LAB — I-STAT BETA HCG BLOOD, ED (MC, WL, AP ONLY): I-stat hCG, quantitative: 7.6 m[IU]/mL — ABNORMAL HIGH (ref <5)

## 2018-11-29 LAB — LACTIC ACID, PLASMA: Lactic Acid, Venous: 1.8 mmol/L (ref 0.5–1.9)

## 2018-11-29 LAB — PROTIME-INR
INR: 1.1 (ref 0.8–1.2)
Prothrombin Time: 13.9 seconds (ref 11.4–15.2)

## 2018-11-29 LAB — SAMPLE TO BLOOD BANK

## 2018-11-29 LAB — ETHANOL: Alcohol, Ethyl (B): <10 mg/dL (ref <10)

## 2018-11-29 LAB — CBG MONITORING, ED: Glucose-Capillary: 105 mg/dL — ABNORMAL HIGH (ref 70–99)

## 2018-11-29 MED ORDER — ONDANSETRON 4 MG PO TBDP: 4.0000 mg | ORAL_TABLET | Freq: Four times a day (QID) | ORAL | Status: DC | PRN

## 2018-11-29 MED ORDER — HYDRALAZINE HCL 20 MG/ML IJ SOLN: 10.0000 mg | INTRAMUSCULAR | Status: DC | PRN

## 2018-11-29 MED ORDER — ONDANSETRON HCL 4 MG/2ML IJ SOLN: 4.0000 mg | Freq: Four times a day (QID) | INTRAMUSCULAR | Status: DC | PRN

## 2018-11-29 MED ORDER — HYDROMORPHONE HCL 1 MG/ML IJ SOLN
1.0000 mg | INTRAMUSCULAR | Status: DC | PRN
  Administered 2018-11-30 (×2): 1 mg via INTRAVENOUS
  Filled 2018-11-29 (×2): qty 1

## 2018-11-29 MED ORDER — DEXTROSE-NACL 5-0.9 % IV SOLN
INTRAVENOUS | Status: DC
  Administered 2018-11-30 (×2): via INTRAVENOUS

## 2018-11-29 MED ORDER — IOHEXOL 300 MG/ML  SOLN
100.0000 mL | Freq: Once | INTRAMUSCULAR | Status: AC | PRN
  Administered 2018-11-29: 21:00:00 100 mL via INTRAVENOUS

## 2018-11-29 MED ORDER — IOHEXOL 350 MG/ML SOLN
50.0000 mL | Freq: Once | INTRAVENOUS | Status: AC | PRN
  Administered 2018-11-29: 50 mL via INTRAVENOUS
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
## 2018-11-29 NOTE — ED Notes (Signed)
Dr, Sherwood Gambler here to see patient
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
## 2018-11-29 NOTE — ED Notes (Signed)
Dr Manuella Ghazi (ophthalmology ) at bedside. He did not dilate pupils --
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
## 2018-11-30 ENCOUNTER — Inpatient Hospital Stay (HOSPITAL_COMMUNITY): Payer: Self-pay

## 2018-11-30 LAB — URINALYSIS, ROUTINE W REFLEX MICROSCOPIC
Bacteria, UA: NONE SEEN
Bilirubin Urine: NEGATIVE
Glucose, UA: NEGATIVE mg/dL
Ketones, ur: NEGATIVE mg/dL
Leukocytes,Ua: NEGATIVE
Nitrite: NEGATIVE
Protein, ur: NEGATIVE mg/dL
Specific Gravity, Urine: >1.046 — ABNORMAL HIGH (ref 1.005–1.030)
pH: 5 (ref 5.0–8.0)

## 2018-11-30 LAB — HIV ANTIBODY (ROUTINE TESTING W REFLEX): HIV Screen 4th Generation wRfx: NONREACTIVE

## 2018-11-30 LAB — GLUCOSE, CAPILLARY
Glucose-Capillary: 95 mg/dL (ref 70–99)
Glucose-Capillary: 86 mg/dL (ref 70–99)
Glucose-Capillary: 100 mg/dL — ABNORMAL HIGH (ref 70–99)

## 2018-11-30 LAB — COMPREHENSIVE METABOLIC PANEL
ALT: 35 U/L (ref 0–44)
AST: 52 U/L — ABNORMAL HIGH (ref 15–41)
Albumin: 3.4 g/dL — ABNORMAL LOW (ref 3.5–5.0)
Alkaline Phosphatase: 43 U/L (ref 38–126)
Anion gap: 12 (ref 5–15)
BUN: 11 mg/dL (ref 6–20)
CO2: 19 mmol/L — ABNORMAL LOW (ref 22–32)
Calcium: 8.9 mg/dL (ref 8.9–10.3)
Chloride: 112 mmol/L — ABNORMAL HIGH (ref 98–111)
Creatinine, Ser: 0.82 mg/dL (ref 0.44–1.00)
GFR calc Af Amer: >60 mL/min (ref >60)
GFR calc non Af Amer: >60 mL/min (ref >60)
Glucose, Bld: 106 mg/dL — ABNORMAL HIGH (ref 70–99)
Potassium: 3.7 mmol/L (ref 3.5–5.1)
Sodium: 143 mmol/L (ref 135–145)
Total Bilirubin: 0.7 mg/dL (ref 0.3–1.2)
Total Protein: 6.4 g/dL — ABNORMAL LOW (ref 6.5–8.1)

## 2018-11-30 LAB — PHOSPHORUS: Phosphorus: 3.4 mg/dL (ref 2.5–4.6)

## 2018-11-30 LAB — MRSA PCR SCREENING: MRSA by PCR: NEGATIVE

## 2018-11-30 LAB — MAGNESIUM: Magnesium: 1.6 mg/dL — ABNORMAL LOW (ref 1.7–2.4)

## 2018-11-30 LAB — CBC
HCT: 39.9 % (ref 36.0–46.0)
Hemoglobin: 13.6 g/dL (ref 12.0–15.0)
MCH: 30.8 pg (ref 26.0–34.0)
MCHC: 34.1 g/dL (ref 30.0–36.0)
MCV: 90.5 fL (ref 80.0–100.0)
Platelets: 169 10*3/uL (ref 150–400)
RBC: 4.41 MIL/uL (ref 3.87–5.11)
RDW: 12.4 % (ref 11.5–15.5)
WBC: 9.1 10*3/uL (ref 4.0–10.5)
nRBC: 0 % (ref 0.0–0.2)

## 2018-11-30 LAB — CDS SEROLOGY

## 2018-11-30 LAB — SARS CORONAVIRUS 2 BY RT PCR (HOSPITAL ORDER, PERFORMED IN ~~LOC~~ HOSPITAL LAB): SARS Coronavirus 2: NEGATIVE

## 2018-11-30 MED ORDER — ACETAMINOPHEN 10 MG/ML IV SOLN: 1000.0000 mg | Freq: Four times a day (QID) | INTRAVENOUS | Status: DC | PRN

## 2018-11-30 MED ORDER — KETOROLAC TROMETHAMINE 15 MG/ML IJ SOLN
15.0000 mg | Freq: Four times a day (QID) | INTRAMUSCULAR | Status: DC | PRN
  Administered 2018-11-30 – 2018-12-04 (×6): 15 mg via INTRAVENOUS
  Filled 2018-11-30 (×7): qty 1

## 2018-11-30 MED ORDER — CHLORHEXIDINE GLUCONATE CLOTH 2 % EX PADS
6.0000 | MEDICATED_PAD | Freq: Every day | CUTANEOUS | Status: DC
  Administered 2018-11-30 – 2018-12-01 (×2): 6 via TOPICAL

## 2018-11-30 MED ORDER — GADOBUTROL 1 MMOL/ML IV SOLN
6.0000 mL | Freq: Once | INTRAVENOUS | Status: AC | PRN
  Administered 2018-11-30: 6 mL via INTRAVENOUS

## 2018-11-30 MED ORDER — PRO-STAT SUGAR FREE PO LIQD: 30.0000 mL | Freq: Two times a day (BID) | ORAL | Status: DC

## 2018-11-30 MED ORDER — LACTATED RINGERS IV BOLUS
1000.0000 mL | Freq: Once | INTRAVENOUS | Status: AC
  Administered 2018-11-30: 1000 mL via INTRAVENOUS

## 2018-11-30 MED ORDER — VITAL HIGH PROTEIN PO LIQD: 1000.0000 mL | ORAL | Status: DC

## 2018-11-30 MED ORDER — TRAMADOL HCL 50 MG PO TABS
25.0000 mg | ORAL_TABLET | Freq: Four times a day (QID) | ORAL | Status: DC | PRN
  Administered 2018-12-01 – 2018-12-04 (×9): 50 mg via ORAL
  Filled 2018-11-30 (×9): qty 1

## 2018-11-30 MED ORDER — LACTATED RINGERS IV SOLN
INTRAVENOUS | Status: DC
  Administered 2018-11-30 – 2018-12-01 (×3): via INTRAVENOUS

## 2018-11-30 NOTE — Progress Notes (Signed)
IO removed from left tibia without difficulty or complications. Pressure applied for 5 minutes and vaseline with gauze dressing placed.
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
## 2018-12-01 ENCOUNTER — Other Ambulatory Visit: Payer: Self-pay

## 2018-12-01 LAB — CBC
HCT: 33.3 % — ABNORMAL LOW (ref 36.0–46.0)
Hemoglobin: 11.3 g/dL — ABNORMAL LOW (ref 12.0–15.0)
MCH: 31.2 pg (ref 26.0–34.0)
MCHC: 33.9 g/dL (ref 30.0–36.0)
MCV: 92 fL (ref 80.0–100.0)
Platelets: 133 10*3/uL — ABNORMAL LOW (ref 150–400)
RBC: 3.62 MIL/uL — ABNORMAL LOW (ref 3.87–5.11)
RDW: 12.4 % (ref 11.5–15.5)
WBC: 4.4 10*3/uL (ref 4.0–10.5)
nRBC: 0 % (ref 0.0–0.2)

## 2018-12-01 LAB — BASIC METABOLIC PANEL
Anion gap: 7 (ref 5–15)
BUN: 7 mg/dL (ref 6–20)
CO2: 23 mmol/L (ref 22–32)
Calcium: 8.7 mg/dL — ABNORMAL LOW (ref 8.9–10.3)
Chloride: 112 mmol/L — ABNORMAL HIGH (ref 98–111)
Creatinine, Ser: 0.69 mg/dL (ref 0.44–1.00)
GFR calc Af Amer: >60 mL/min (ref >60)
GFR calc non Af Amer: >60 mL/min (ref >60)
Glucose, Bld: 91 mg/dL (ref 70–99)
Potassium: 3.4 mmol/L — ABNORMAL LOW (ref 3.5–5.1)
Sodium: 142 mmol/L (ref 135–145)

## 2018-12-01 LAB — PHOSPHORUS: Phosphorus: 3.6 mg/dL (ref 2.5–4.6)

## 2018-12-01 LAB — GLUCOSE, CAPILLARY
Glucose-Capillary: 94 mg/dL (ref 70–99)
Glucose-Capillary: 100 mg/dL — ABNORMAL HIGH (ref 70–99)

## 2018-12-01 LAB — MAGNESIUM: Magnesium: 1.6 mg/dL — ABNORMAL LOW (ref 1.7–2.4)

## 2018-12-01 MED ORDER — ENOXAPARIN SODIUM 30 MG/0.3ML ~~LOC~~ SOLN
30.0000 mg | Freq: Two times a day (BID) | SUBCUTANEOUS | Status: DC
  Administered 2018-12-01 – 2018-12-02 (×3): 30 mg via SUBCUTANEOUS
  Filled 2018-12-01 (×3): qty 0.3

## 2018-12-01 MED ORDER — POTASSIUM CHLORIDE 20 MEQ/15ML (10%) PO SOLN
10.0000 meq | Freq: Three times a day (TID) | ORAL | Status: AC
  Administered 2018-12-01 – 2018-12-02 (×3): 10 meq via ORAL
  Filled 2018-12-01 (×3): qty 15

## 2018-12-01 MED ORDER — POTASSIUM CITRATE-CITRIC ACID 1100-334 MG/5ML PO SOLN
10.0000 meq | Freq: Three times a day (TID) | ORAL | Status: DC
  Filled 2018-12-01 (×3): qty 5

## 2018-12-01 MED ORDER — MAGNESIUM SULFATE 2 GM/50ML IV SOLN
2.0000 g | Freq: Once | INTRAVENOUS | Status: AC
  Administered 2018-12-01: 2 g via INTRAVENOUS
  Filled 2018-12-01: qty 50

## 2018-12-01 MED ORDER — ACETAMINOPHEN 325 MG PO TABS
650.0000 mg | ORAL_TABLET | Freq: Four times a day (QID) | ORAL | Status: DC | PRN
  Administered 2018-12-02 – 2018-12-04 (×6): 650 mg via ORAL
  Filled 2018-12-01 (×6): qty 2

## 2018-12-01 NOTE — Progress Notes (Signed)
Rehab Admissions Coordinator Note:  Per PT recommendation, patient was screened by Michel Santee for appropriateness for an Inpatient Acute Rehab Consult.  At this time, we are recommending Inpatient Rehab consult.  Note that pt is already functioning at min guard level or better and may progress too quickly to require CIR level therapy for PT/OT.   Michel Santee 12/01/2018, 1:27 PM  I can be reached at [phone].
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
## 2018-12-01 NOTE — Progress Notes (Signed)
Daughter Vikki Ports would like to be called by MD and updated with MRI results :  [phone]

## 2018-12-02 ENCOUNTER — Inpatient Hospital Stay (HOSPITAL_COMMUNITY): Payer: Self-pay

## 2018-12-02 LAB — BASIC METABOLIC PANEL
Anion gap: 6 (ref 5–15)
BUN: 7 mg/dL (ref 6–20)
CO2: 25 mmol/L (ref 22–32)
Calcium: 8.4 mg/dL — ABNORMAL LOW (ref 8.9–10.3)
Chloride: 111 mmol/L (ref 98–111)
Creatinine, Ser: 0.69 mg/dL (ref 0.44–1.00)
GFR calc Af Amer: >60 mL/min (ref >60)
GFR calc non Af Amer: >60 mL/min (ref >60)
Glucose, Bld: 103 mg/dL — ABNORMAL HIGH (ref 70–99)
Potassium: 3.3 mmol/L — ABNORMAL LOW (ref 3.5–5.1)
Sodium: 142 mmol/L (ref 135–145)

## 2018-12-02 LAB — PHOSPHORUS: Phosphorus: 3.3 mg/dL (ref 2.5–4.6)

## 2018-12-02 LAB — CBC
HCT: 29.4 % — ABNORMAL LOW (ref 36.0–46.0)
Hemoglobin: 10.2 g/dL — ABNORMAL LOW (ref 12.0–15.0)
MCH: 31.2 pg (ref 26.0–34.0)
MCHC: 34.7 g/dL (ref 30.0–36.0)
MCV: 89.9 fL (ref 80.0–100.0)
Platelets: 129 10*3/uL — ABNORMAL LOW (ref 150–400)
RBC: 3.27 MIL/uL — ABNORMAL LOW (ref 3.87–5.11)
RDW: 11.9 % (ref 11.5–15.5)
WBC: 4.8 10*3/uL (ref 4.0–10.5)
nRBC: 0 % (ref 0.0–0.2)

## 2018-12-02 LAB — MAGNESIUM: Magnesium: 1.6 mg/dL — ABNORMAL LOW (ref 1.7–2.4)

## 2018-12-02 LAB — HEPATIC FUNCTION PANEL
ALT: 23 U/L (ref 0–44)
AST: 29 U/L (ref 15–41)
Albumin: 2.7 g/dL — ABNORMAL LOW (ref 3.5–5.0)
Alkaline Phosphatase: 38 U/L (ref 38–126)
Bilirubin, Direct: 0.2 mg/dL (ref 0.0–0.2)
Indirect Bilirubin: 0.9 mg/dL (ref 0.3–0.9)
Total Bilirubin: 1.1 mg/dL (ref 0.3–1.2)
Total Protein: 5.2 g/dL — ABNORMAL LOW (ref 6.5–8.1)

## 2018-12-02 MED ORDER — MAGNESIUM SULFATE 2 GM/50ML IV SOLN
2.0000 g | Freq: Once | INTRAVENOUS | Status: AC
  Administered 2018-12-02: 2 g via INTRAVENOUS
  Filled 2018-12-02: qty 50

## 2018-12-02 MED ORDER — POTASSIUM CHLORIDE 20 MEQ/15ML (10%) PO SOLN
40.0000 meq | Freq: Once | ORAL | Status: AC
  Administered 2018-12-02: 40 meq via ORAL
  Filled 2018-12-02: qty 30

## 2018-12-02 NOTE — Progress Notes (Signed)
MD notified of increase swelling to pt left temporal region and increase in pain and pressure voiced from pt. Now new orders received will continue to monitor. Pt VS stable and no changes in neuro status.
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
## 2018-12-02 NOTE — Progress Notes (Signed)
Vitals:   12/01/18 2309 12/02/18 0306 12/02/18 0307 12/02/18 0349  BP: 106/66  108/76   Pulse: 63  (!) 58   Resp: 19  20   Temp:  99 F (37.2 C)    TempSrc:  Oral    SpO2: 99%  96%   Weight:    59.7 kg  Height:        CBC Recent Labs    12/01/18 0537 12/02/18 0255  WBC 4.4 4.8  HGB 11.3* 10.2*  HCT 33.3* 29.4*  PLT 133* 129*   BMET Recent Labs    12/01/18 0537 12/02/18 0255  NA 142 142  K 3.4* 3.3*  CL 112* 111  CO2 23 25  GLUCOSE 91 103*  BUN 7 7  CREATININE 0.69 0.69  CALCIUM 8.7* 8.4*    Patient resting in bed, describes mild headache.  Limited p.o. intake.  Awake and alert, oriented to name, [facility], and December 02, 2018.  Following commands.  Speech fluent.  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  Right eye EOMI.  Facial movement symmetrical.  Moving all 4 extremities well.  No drift of upper extremities.  Plan: Stable from neurosurgical perspective.  Will obtain probable final follow-up CT of head.  Patient is going to require rehabilitation under the direction of PM&R acute care hospitalization and ophthalmology/neuro-ophthalmology follow-up regarding complete left 3rd nerve palsy.  Do not anticipate a need for post discharge neurosurgical follow-up.  Case discussed with Dr. Reather Laurence (trauma surgery).  Michelle Spangle, MD 12/02/2018, 7:44 AM

## 2018-12-02 NOTE — Plan of Care (Signed)
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
## 2018-12-03 LAB — BASIC METABOLIC PANEL
Anion gap: 7 (ref 5–15)
BUN: 8 mg/dL (ref 6–20)
CO2: 25 mmol/L (ref 22–32)
Calcium: 8 mg/dL — ABNORMAL LOW (ref 8.9–10.3)
Chloride: 109 mmol/L (ref 98–111)
Creatinine, Ser: 0.69 mg/dL (ref 0.44–1.00)
GFR calc Af Amer: >60 mL/min (ref >60)
GFR calc non Af Amer: >60 mL/min (ref >60)
Glucose, Bld: 98 mg/dL (ref 70–99)
Potassium: 3.4 mmol/L — ABNORMAL LOW (ref 3.5–5.1)
Sodium: 141 mmol/L (ref 135–145)

## 2018-12-03 LAB — CBC
HCT: 27.9 % — ABNORMAL LOW (ref 36.0–46.0)
Hemoglobin: 9.7 g/dL — ABNORMAL LOW (ref 12.0–15.0)
MCH: 31.1 pg (ref 26.0–34.0)
MCHC: 34.8 g/dL (ref 30.0–36.0)
MCV: 89.4 fL (ref 80.0–100.0)
Platelets: 151 10*3/uL (ref 150–400)
RBC: 3.12 MIL/uL — ABNORMAL LOW (ref 3.87–5.11)
RDW: 12 % (ref 11.5–15.5)
WBC: 5.4 10*3/uL (ref 4.0–10.5)
nRBC: 0 % (ref 0.0–0.2)

## 2018-12-03 LAB — MAGNESIUM: Magnesium: 1.7 mg/dL (ref 1.7–2.4)

## 2018-12-03 LAB — PHOSPHORUS: Phosphorus: 4.3 mg/dL (ref 2.5–4.6)

## 2018-12-03 MED ORDER — TRAMADOL HCL 50 MG PO TABS: 25.0000 mg | ORAL_TABLET | Freq: Four times a day (QID) | ORAL | 0 refills | Status: DC | PRN

## 2018-12-03 MED ORDER — POTASSIUM CHLORIDE CRYS ER 20 MEQ PO TBCR
20.0000 meq | EXTENDED_RELEASE_TABLET | Freq: Two times a day (BID) | ORAL | Status: AC
  Administered 2018-12-03 (×2): 20 meq via ORAL
  Filled 2018-12-03 (×2): qty 1

## 2018-12-03 MED ORDER — ACETAMINOPHEN 325 MG PO TABS: 650.0000 mg | ORAL_TABLET | Freq: Four times a day (QID) | ORAL | Status: DC | PRN

## 2018-12-03 MED FILL — traMADol HCL 50 MG TABS: 50 | 7 days supply | Qty: 30 | Fill #0
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
## 2018-12-03 NOTE — Progress Notes (Signed)
Subjective: Patient resting comfortably in bed, denies headache or other neurologic complaints.  CT of brain yesterday shows expected evolution of cerebral contusions, small subdural hematoma again difficult to visualize, no noticeable mass-effect or shift.  Objective: Vital signs in last 24 hours: Vitals:   12/03/18 0329 12/03/18 0330 12/03/18 0738 12/03/18 1129  BP:  102/70 98/81 98/68   Pulse:    62  Resp:  (!) 22 15 16   Temp: 98.3 F (36.8 C)  97.9 F (36.6 C) 98.2 F (36.8 C)  TempSrc: Oral  Oral Oral  SpO2:    99%  Weight:      Height:        Intake/Output from previous day: 10/28 0701 - 10/29 0700 In: 240 [P.O.:240] Out: -  Intake/Output this shift: No intake/output data recorded.  Physical Exam: Awake and alert, oriented to name, [facility], and October 2020.  Following commands.  Speech fluent.  Left 3rd nerve palsy without change.  Right EOMI.  Facial movement symmetrical.  Good strength in all 4 extremities.  No drift of upper extremities.  CBC Recent Labs    12/02/18 0255 12/03/18 0358  WBC 4.8 5.4  HGB 10.2* 9.7*  HCT 29.4* 27.9*  PLT 129* 151   BMET Recent Labs    12/02/18 0255 12/03/18 0358  NA 142 141  K 3.3* 3.4*  CL 111 109  CO2 25 25  GLUCOSE 103* 98  BUN 7 8  CREATININE 0.69 0.69  CALCIUM 8.4* 8.0*   ABG    Component Value Date/Time   TCO2 26 11/29/2018 2104    Studies/Results: Ct Head Wo Contrast  Result Date: 12/02/2018 CLINICAL DATA:  Head trauma, neuro decline, follow-up EXAM: CT HEAD WITHOUT CONTRAST TECHNIQUE: Contiguous axial images were obtained from the base of the skull through the vertex without intravenous contrast. COMPARISON:  November 10, 2018 FINDINGS: Brain: Persistent hyperdensity along the medial right temporal lobe likely reflecting evolving contusion currently measuring 1.5 x 1 cm axially. There is mild associated edema. A punctate focus of hyperdensity in the left frontal centrum semiovale (axial image 24)  reflects an additional area of traumatic hemorrhage corresponding to abnormal signal on prior MRI. There is small residual subdural hemorrhage along the tentorium. No significant mass effect. Gray-white differentiation is preserved. Ventricles are normal in size. Vascular: No hyperdense vessel or unexpected calcification. Skull: Calvarium is intact. Sinuses/Orbits: There is near total opacification of the imaged left maxillary sinus with hyperdensity that may reflect hemorrhage. There are fractures of the anterioinferior and inferolateral walls of the sinus with minimal displacement Other: Persistent left frontal scalp hematoma. IMPRESSION: Evolving right temporal hemorrhagic contusion with mild edema similar in size to prior MRI. Punctate focus of hemorrhagic left frontal white matter injury also present on the MRI. Residual small volume subdural hemorrhage. Minimally displaced fractures of the left maxillary sinus. Increased sinus opacification with presumed hemorrhage. Consider maxillofacial CT. Electronically Signed   By: Macy Mis M.D.   On: 12/02/2018 10:17   Ct Maxillofacial Wo Contrast  Result Date: 12/02/2018 CLINICAL DATA:  Facial bone fracture as follow-up to head CT. EXAM: CT MAXILLOFACIAL WITHOUT CONTRAST TECHNIQUE: Multidetector CT imaging of the maxillofacial structures was performed. Multiplanar CT image reconstructions were also generated. COMPARISON:  Head CT same day. FINDINGS: Osseous: Evidence of patient's known minimally displaced fracture along the most inferior aspect of the anterolateral wall of the left maxillary sinus. Associated hemorrhagic debris filling the left maxillary sinus. No additional facial bone fractures visualized. Orbits: Globes and  retrobulbar spaces are normal. Minimal left periorbital soft tissue swelling. Sinuses: Complete opacification of the left maxillary sinus compatible with hemorrhagic debris due to patient's known fracture. Soft tissues: Left periorbital  and left frontal scalp soft tissue swelling. Limited intracranial: Known acute hemorrhage over the medial right temporal lobe. Possible small amount of acute subdural hemorrhage along the tentorium. Possible small amount of acute subdural/subarachnoid hemorrhage over the frontal lobes versus artifact as this was not well seen on patient's recent head CT. IMPRESSION: 1. Subtle minimally displaced fracture along the most inferior aspect of the anterolateral wall of the left maxillary sinus. Associated hemorrhagic debris filling the left maxillary sinus. 2. Known hemorrhagic contusion over the medial right temporal lobe without significant change possible small amount of acute subdural hemorrhage over the tentorium unchanged. Minimal acute subarachnoid/subdural hemorrhage over the frontal lobes versus artifact. 3. Soft tissue swelling over the left frontal scalp and left periorbital region. Electronically Signed   By: Elberta Fortis M.D.   On: 12/02/2018 13:00    Assessment/Plan: Appears more alert each day.  Neurologically intact other than for left 3rd nerve palsy.  Will not require post discharge neurosurgical follow-up.  PM&R follow-up if indicated from language/cognitive evaluation.  We will sign off case.  Please recontact if significant neurosurgical issue arises.  Hewitt Shorts, MD 12/03/2018, 1:01 PM
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
## 2018-12-03 NOTE — Plan of Care (Signed)
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
## 2018-12-04 LAB — BASIC METABOLIC PANEL
Anion gap: 10 (ref 5–15)
BUN: 10 mg/dL (ref 6–20)
CO2: 22 mmol/L (ref 22–32)
Calcium: 8.5 mg/dL — ABNORMAL LOW (ref 8.9–10.3)
Chloride: 110 mmol/L (ref 98–111)
Creatinine, Ser: 0.71 mg/dL (ref 0.44–1.00)
GFR calc Af Amer: >60 mL/min (ref >60)
GFR calc non Af Amer: >60 mL/min (ref >60)
Glucose, Bld: 81 mg/dL (ref 70–99)
Potassium: 3.9 mmol/L (ref 3.5–5.1)
Sodium: 142 mmol/L (ref 135–145)

## 2018-12-04 LAB — CBC
HCT: 32 % — ABNORMAL LOW (ref 36.0–46.0)
Hemoglobin: 11 g/dL — ABNORMAL LOW (ref 12.0–15.0)
MCH: 31.8 pg (ref 26.0–34.0)
MCHC: 34.4 g/dL (ref 30.0–36.0)
MCV: 92.5 fL (ref 80.0–100.0)
Platelets: 172 10*3/uL (ref 150–400)
RBC: 3.46 MIL/uL — ABNORMAL LOW (ref 3.87–5.11)
RDW: 12.3 % (ref 11.5–15.5)
WBC: 6.2 10*3/uL (ref 4.0–10.5)
nRBC: 0 % (ref 0.0–0.2)

## 2018-12-04 LAB — MAGNESIUM: Magnesium: 1.6 mg/dL — ABNORMAL LOW (ref 1.7–2.4)

## 2018-12-04 LAB — PHOSPHORUS: Phosphorus: 3.6 mg/dL (ref 2.5–4.6)
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
## 2018-12-04 NOTE — Progress Notes (Signed)
Inpatient Rehab Admissions Coordinator:   Following up for [employer].  Pt currently too high level for CIR admission, plan for d/c home with family 24/7 support.  Will sign off at this time.   Shann Medal, PT, DPT Admissions Coordinator [phone] 12/04/18  10:57 AM
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
## 2018-12-04 NOTE — Plan of Care (Signed)
  Problem: Health Behavior/Discharge Planning: Goal: Ability to manage health-related needs will improve Outcome: Adequate for Discharge Note: Received discharge order for patient. Discharge instructions reviewed with patient, including new medication/ prescriptions, activity, follow-up appointment, and any special instructions. At this time patient have stated understanding of discharge instructions. Patient with no further questions at this time. Pt stable in no acute distress. PIV removed, intact. Pt off unit in wheelchair. Nursing care complete.

## 2019-06-03 ENCOUNTER — Ambulatory Visit: Payer: Self-pay

## 2019-06-12 ENCOUNTER — Emergency Department
Admission: EM | Admit: 2019-06-12 | Discharge: 2019-06-12 | Disposition: A | Discharge status: Home / Self Care | Payer: Self-pay | Attending: Emergency Medicine | Admitting: Emergency Medicine

## 2019-06-12 ENCOUNTER — Other Ambulatory Visit: Payer: Self-pay

## 2019-06-12 ENCOUNTER — Encounter: Payer: Self-pay | Admitting: Emergency Medicine

## 2019-06-12 DIAGNOSIS — Z5321 Procedure and treatment not carried out due to patient leaving prior to being seen by health care provider: Secondary | ICD-10-CM | POA: Insufficient documentation

## 2019-06-12 DIAGNOSIS — F410 Panic disorder [episodic paroxysmal anxiety] without agoraphobia: Secondary | ICD-10-CM | POA: Insufficient documentation

## 2019-06-12 HISTORY — DX: Anxiety disorder, unspecified: F41.9

## 2019-06-12 LAB — URINE DRUG SCREEN, QUALITATIVE (ARMC ONLY)
Amphetamines, Ur Screen: POSITIVE — AB
Barbiturates, Ur Screen: NOT DETECTED
Benzodiazepine, Ur Scrn: POSITIVE — AB
Cannabinoid 50 Ng, Ur ~~LOC~~: NOT DETECTED
Cocaine Metabolite,Ur ~~LOC~~: POSITIVE — AB
MDMA (Ecstasy)Ur Screen: NOT DETECTED
Methadone Scn, Ur: NOT DETECTED
Opiate, Ur Screen: NOT DETECTED
Phencyclidine (PCP) Ur S: NOT DETECTED
Tricyclic, Ur Screen: NOT DETECTED

## 2019-06-12 LAB — SALICYLATE LEVEL: Salicylate Lvl: <7 mg/dL — ABNORMAL LOW (ref 7.0–30.0)

## 2019-06-12 LAB — COMPREHENSIVE METABOLIC PANEL
ALT: 24 U/L (ref 0–44)
AST: 28 U/L (ref 15–41)
Albumin: 5.1 g/dL — ABNORMAL HIGH (ref 3.5–5.0)
Alkaline Phosphatase: 57 U/L (ref 38–126)
Anion gap: 10 (ref 5–15)
BUN: 13 mg/dL (ref 6–20)
CO2: 29 mmol/L (ref 22–32)
Calcium: 9.7 mg/dL (ref 8.9–10.3)
Chloride: 101 mmol/L (ref 98–111)
Creatinine, Ser: 0.83 mg/dL (ref 0.44–1.00)
GFR calc Af Amer: >60 mL/min (ref >60)
GFR calc non Af Amer: >60 mL/min (ref >60)
Glucose, Bld: 65 mg/dL — ABNORMAL LOW (ref 70–99)
Potassium: 3.3 mmol/L — ABNORMAL LOW (ref 3.5–5.1)
Sodium: 140 mmol/L (ref 135–145)
Total Bilirubin: 0.8 mg/dL (ref 0.3–1.2)
Total Protein: 9.1 g/dL — ABNORMAL HIGH (ref 6.5–8.1)

## 2019-06-12 LAB — URINALYSIS, COMPLETE (UACMP) WITH MICROSCOPIC
Bilirubin Urine: NEGATIVE
Glucose, UA: NEGATIVE mg/dL
Hgb urine dipstick: NEGATIVE
Ketones, ur: NEGATIVE mg/dL
Nitrite: NEGATIVE
Protein, ur: NEGATIVE mg/dL
Specific Gravity, Urine: 1.026 (ref 1.005–1.030)
pH: 5 (ref 5.0–8.0)

## 2019-06-12 LAB — CBC
HCT: 43.3 % (ref 36.0–46.0)
Hemoglobin: 15.1 g/dL — ABNORMAL HIGH (ref 12.0–15.0)
MCH: 31.7 pg (ref 26.0–34.0)
MCHC: 34.9 g/dL (ref 30.0–36.0)
MCV: 90.8 fL (ref 80.0–100.0)
Platelets: 302 10*3/uL (ref 150–400)
RBC: 4.77 MIL/uL (ref 3.87–5.11)
RDW: 11.6 % (ref 11.5–15.5)
WBC: 7.1 10*3/uL (ref 4.0–10.5)
nRBC: 0 % (ref 0.0–0.2)

## 2019-06-12 LAB — ACETAMINOPHEN LEVEL: Acetaminophen (Tylenol), Serum: <10 ug/mL — ABNORMAL LOW (ref 10–30)

## 2019-06-12 LAB — ETHANOL: Alcohol, Ethyl (B): <10 mg/dL (ref <10)

## 2019-06-12 LAB — POCT PREGNANCY, URINE: Preg Test, Ur: NEGATIVE

## 2019-06-12 NOTE — ED Notes (Signed)
Label maker inop  Pt denies SI or HI or hallucinations; pt reports NOT wanting to be admitted because she has to take care of her elderly uncle that she lives with
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
## 2020-02-06 ENCOUNTER — Encounter: Payer: Self-pay | Admitting: Emergency Medicine

## 2020-02-06 ENCOUNTER — Emergency Department
Admission: EM | Admit: 2020-02-06 | Discharge: 2020-02-06 | Disposition: A | Discharge status: Home / Self Care | Payer: Self-pay | Attending: Emergency Medicine | Admitting: Emergency Medicine

## 2020-02-06 ENCOUNTER — Other Ambulatory Visit: Payer: Self-pay

## 2020-02-06 DIAGNOSIS — Z5321 Procedure and treatment not carried out due to patient leaving prior to being seen by health care provider: Secondary | ICD-10-CM | POA: Insufficient documentation

## 2020-02-06 DIAGNOSIS — R079 Chest pain, unspecified: Secondary | ICD-10-CM | POA: Insufficient documentation

## 2020-02-06 DIAGNOSIS — R300 Dysuria: Secondary | ICD-10-CM | POA: Insufficient documentation

## 2020-02-06 DIAGNOSIS — F419 Anxiety disorder, unspecified: Secondary | ICD-10-CM | POA: Insufficient documentation

## 2020-02-06 DIAGNOSIS — R82998 Other abnormal findings in urine: Secondary | ICD-10-CM | POA: Insufficient documentation

## 2020-02-06 LAB — CBC
HCT: 37 % (ref 36.0–46.0)
Hemoglobin: 13 g/dL (ref 12.0–15.0)
MCH: 30.6 pg (ref 26.0–34.0)
MCHC: 35.1 g/dL (ref 30.0–36.0)
MCV: 87.1 fL (ref 80.0–100.0)
Platelets: 200 10*3/uL (ref 150–400)
RBC: 4.25 MIL/uL (ref 3.87–5.11)
RDW: 12 % (ref 11.5–15.5)
WBC: 4 10*3/uL (ref 4.0–10.5)
nRBC: 0 % (ref 0.0–0.2)

## 2020-02-06 LAB — BASIC METABOLIC PANEL
Anion gap: 11 (ref 5–15)
BUN: 14 mg/dL (ref 6–20)
CO2: 24 mmol/L (ref 22–32)
Calcium: 8.5 mg/dL — ABNORMAL LOW (ref 8.9–10.3)
Chloride: 103 mmol/L (ref 98–111)
Creatinine, Ser: 0.89 mg/dL (ref 0.44–1.00)
GFR, Estimated: >60 mL/min (ref >60)
Glucose, Bld: 94 mg/dL (ref 70–99)
Potassium: 2.9 mmol/L — ABNORMAL LOW (ref 3.5–5.1)
Sodium: 138 mmol/L (ref 135–145)

## 2020-02-06 LAB — URINALYSIS, COMPLETE (UACMP) WITH MICROSCOPIC
Bilirubin Urine: NEGATIVE
Glucose, UA: NEGATIVE mg/dL
Ketones, ur: 5 mg/dL — AB
Nitrite: POSITIVE — AB
Protein, ur: 30 mg/dL — AB
Specific Gravity, Urine: 1.026 (ref 1.005–1.030)
WBC, UA: >50 WBC/hpf — ABNORMAL HIGH (ref 0–5)
pH: 5 (ref 5.0–8.0)

## 2020-02-06 LAB — TROPONIN I (HIGH SENSITIVITY): Troponin I (High Sensitivity): 4 ng/L (ref <18)

## 2020-02-06 NOTE — ED Triage Notes (Signed)
Pt to ED via [facility], pt states that she smoked crack today. Pt states that she has used crack in the but has stopped. Pt states that she recently relapsed. Pt reports that today after doing crack she had chest pain. Pt states that it made her very anxious and was hiding under blankets because she was seeing lights every where. Pt states that she also took a tiny piece of Suboxone today as well. Pt is tearful in triage.

## 2020-02-06 NOTE — ED Notes (Signed)
Pt states that she is also having painful urination, dark urine, and foul smelling urine.
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
## 2020-02-06 NOTE — ED Notes (Signed)
IV access from EMS removed at this time. Pt does not want to stay to be seen.

## 2020-03-01 ENCOUNTER — Encounter: Payer: Self-pay | Admitting: Physician Assistant

## 2020-03-01 ENCOUNTER — Other Ambulatory Visit: Payer: Self-pay

## 2020-03-01 ENCOUNTER — Ambulatory Visit: Payer: Self-pay | Admitting: Physician Assistant

## 2020-03-01 DIAGNOSIS — Z113 Encounter for screening for infections with a predominantly sexual mode of transmission: Secondary | ICD-10-CM

## 2020-03-01 DIAGNOSIS — Z7189 Other specified counseling: Secondary | ICD-10-CM

## 2020-03-01 NOTE — Progress Notes (Signed)
Patient into clinic scheduled for STD screening but requesting evaluation for an UTI.  Counseled patient that we can only screen for STDs since we do not do primary care here at [facility].  Enc patient to see urgent/walk in clinic , PCP or ER for evaluation.  Patient declines STD screening today and states that she may return at another time if she needs those services.

## 2020-04-02 ENCOUNTER — Encounter: Payer: Self-pay | Admitting: Internal Medicine

## 2020-04-02 ENCOUNTER — Observation Stay
Admission: EM | Admit: 2020-04-02 | Discharge: 2020-04-03 | Disposition: A | Discharge status: Home / Self Care | Payer: Self-pay | Attending: Internal Medicine | Admitting: Internal Medicine

## 2020-04-02 ENCOUNTER — Other Ambulatory Visit: Payer: Self-pay

## 2020-04-02 DIAGNOSIS — N390 Urinary tract infection, site not specified: Secondary | ICD-10-CM | POA: Insufficient documentation

## 2020-04-02 DIAGNOSIS — T465X1A Poisoning by other antihypertensive drugs, accidental (unintentional), initial encounter: Principal | ICD-10-CM | POA: Insufficient documentation

## 2020-04-02 DIAGNOSIS — F1490 Cocaine use, unspecified, uncomplicated: Secondary | ICD-10-CM | POA: Insufficient documentation

## 2020-04-02 DIAGNOSIS — F419 Anxiety disorder, unspecified: Secondary | ICD-10-CM

## 2020-04-02 DIAGNOSIS — F17200 Nicotine dependence, unspecified, uncomplicated: Secondary | ICD-10-CM | POA: Insufficient documentation

## 2020-04-02 LAB — URINALYSIS, COMPLETE (UACMP) WITH MICROSCOPIC
Bilirubin Urine: NEGATIVE
Glucose, UA: 50 mg/dL — AB
Hgb urine dipstick: NEGATIVE
Ketones, ur: NEGATIVE mg/dL
Leukocytes,Ua: NEGATIVE
Nitrite: POSITIVE — AB
Protein, ur: 100 mg/dL — AB
Specific Gravity, Urine: 1.031 — ABNORMAL HIGH (ref 1.005–1.030)
WBC, UA: >50 WBC/hpf — ABNORMAL HIGH (ref 0–5)
pH: 5 (ref 5.0–8.0)

## 2020-04-02 LAB — CBC WITH DIFFERENTIAL/PLATELET
Abs Immature Granulocytes: 0.01 10*3/uL (ref 0.00–0.07)
Basophils Absolute: 0 10*3/uL (ref 0.0–0.1)
Basophils Relative: 0 %
Eosinophils Absolute: 0.1 10*3/uL (ref 0.0–0.5)
Eosinophils Relative: 2 %
HCT: 37.3 % (ref 36.0–46.0)
Hemoglobin: 12.2 g/dL (ref 12.0–15.0)
Immature Granulocytes: 0 %
Lymphocytes Relative: 51 %
Lymphs Abs: 2.9 10*3/uL (ref 0.7–4.0)
MCH: 30 pg (ref 26.0–34.0)
MCHC: 32.7 g/dL (ref 30.0–36.0)
MCV: 91.6 fL (ref 80.0–100.0)
Monocytes Absolute: 0.3 10*3/uL (ref 0.1–1.0)
Monocytes Relative: 5 %
Neutro Abs: 2.4 10*3/uL (ref 1.7–7.7)
Neutrophils Relative %: 42 %
Platelets: 240 10*3/uL (ref 150–400)
RBC: 4.07 MIL/uL (ref 3.87–5.11)
RDW: 12.7 % (ref 11.5–15.5)
WBC: 5.8 10*3/uL (ref 4.0–10.5)
nRBC: 0 % (ref 0.0–0.2)

## 2020-04-02 LAB — COMPREHENSIVE METABOLIC PANEL
ALT: 17 U/L (ref 0–44)
AST: 22 U/L (ref 15–41)
Albumin: 3.5 g/dL (ref 3.5–5.0)
Alkaline Phosphatase: 48 U/L (ref 38–126)
Anion gap: 7 (ref 5–15)
BUN: 15 mg/dL (ref 6–20)
CO2: 27 mmol/L (ref 22–32)
Calcium: 8.9 mg/dL (ref 8.9–10.3)
Chloride: 105 mmol/L (ref 98–111)
Creatinine, Ser: 0.7 mg/dL (ref 0.44–1.00)
GFR, Estimated: >60 mL/min (ref >60)
Glucose, Bld: 129 mg/dL — ABNORMAL HIGH (ref 70–99)
Potassium: 3.8 mmol/L (ref 3.5–5.1)
Sodium: 139 mmol/L (ref 135–145)
Total Bilirubin: 0.6 mg/dL (ref 0.3–1.2)
Total Protein: 6.8 g/dL (ref 6.5–8.1)

## 2020-04-02 MED ORDER — SULFAMETHOXAZOLE-TRIMETHOPRIM 800-160 MG PO TABS: 1.0000 | ORAL_TABLET | Freq: Two times a day (BID) | ORAL | Status: DC

## 2020-04-02 MED ORDER — SODIUM CHLORIDE 0.9 % IV SOLN: INTRAVENOUS | Status: AC

## 2020-04-02 MED ORDER — ATROPINE SULFATE 0.4 MG/ML IJ SOLN
0.4000 mg | Freq: Once | INTRAMUSCULAR | Status: DC | PRN
  Filled 2020-04-02: qty 1

## 2020-04-02 MED ORDER — NITROFURANTOIN MONOHYD MACRO 100 MG PO CAPS: 100.0000 mg | ORAL_CAPSULE | Freq: Two times a day (BID) | ORAL | Status: DC

## 2020-04-02 MED ORDER — SODIUM CHLORIDE 0.9 % IV BOLUS
1000.0000 mL | Freq: Once | INTRAVENOUS | Status: AC
  Administered 2020-04-02: 1000 mL via INTRAVENOUS

## 2020-04-02 MED ORDER — ENOXAPARIN SODIUM 40 MG/0.4ML ~~LOC~~ SOLN
40.0000 mg | SUBCUTANEOUS | Status: DC
  Filled 2020-04-02: qty 0.4

## 2020-04-02 MED ORDER — SODIUM CHLORIDE 0.9% FLUSH
3.0000 mL | Freq: Two times a day (BID) | INTRAVENOUS | Status: DC
  Administered 2020-04-03: 3 mL via INTRAVENOUS

## 2020-04-02 MED ORDER — NITROFURANTOIN MONOHYD MACRO 100 MG PO CAPS
100.0000 mg | ORAL_CAPSULE | Freq: Two times a day (BID) | ORAL | Status: DC
  Administered 2020-04-03: 100 mg via ORAL
  Filled 2020-04-02 (×2): qty 1

## 2020-04-02 MED ORDER — SODIUM CHLORIDE 0.9 % IV SOLN
1.0000 g | Freq: Once | INTRAVENOUS | Status: AC
  Administered 2020-04-02: 1 g via INTRAVENOUS
  Filled 2020-04-02: qty 10

## 2020-04-02 NOTE — ED Notes (Signed)
Spoke with dr. Derrill Kay regarding pt's chief complaint and vital signs, orders received.

## 2020-04-02 NOTE — ED Notes (Signed)
Per Angelique Blonder at poison control, give IVF and observe for a minimum of 6 hours, up to 24 hours of observation if symptomatic. Deep sternal rub for excessive drowsiness.  Monitor for bradycardia and sedation needing intubation. Atropine for bradycardia.  Symptoms of clonidine will wax and wane. Pt would need to be asymptomatic x 3 hours for clearance. EKG q4 hours. Place patient on cardiac monitoring.  No labs recommended.

## 2020-04-02 NOTE — ED Notes (Signed)
Poison Control contacted at this time.
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
## 2020-04-02 NOTE — ED Triage Notes (Signed)
Pt states she may have accidentally taken two of her mother's clonidine pills an hour ago by mistake. Pt also complains of "raging urinary tract infection" and cocaine  Use last night. Pt states she does not feel "right". Pt appears in no acute distress.

## 2020-04-02 NOTE — ED Notes (Signed)
Dr Fuller Plan notified of recommendations.

## 2020-04-02 NOTE — ED Notes (Signed)
Pt moved to rm 10 for continuous cardiac monitoring.

## 2020-04-02 NOTE — ED Notes (Addendum)
Pt reports accidentally taking 2 of her mother's clonidine approx 2 hr ago. Pt denies SI/self harm intent. Pt sts she took Clonidine due to it looking like her prescribed Klonopin.  Pt also reports taking a "piece" of suboxone 2 hrs ago as well.  Pt reports feeling drowsy, having slurred speech, and "slight" confusion.  Pt sts she feels better now.  Pt reports calling poison control PTA and was referred to the [facility].  Pt admits to cocaine use 5 days ago. Denies ETOH or other drug use today.

## 2020-04-02 NOTE — ED Notes (Addendum)
Online pill identifier used to lookup scored, round, orange tablet with R inscribed on it. Per picture provided by patient, number 8 also seen on pill.  According to pill lookup, tablet was likely 0.2mg  Clonidine.  Dr. Fuller Plan at bedside.
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
## 2020-04-02 NOTE — H&P (Addendum)
History and Physical   SYMPHONY DEMURO MOL:[id number] DOB: Jul 11, 1975 DOA: 04/02/2020  PCP: Patient, No Pcp Per   Patient coming from: Home  Chief Complaint: Accidental overdose  HPI: Michelle Garza is a 45 y.o. female with medical history significant of anxiety, anemia now status post hysterectomy, hepatitis C, cocaine use who presents following accidental overdose at home.  Patient states that she was caring some anxiety and suffering from UTI today.  She took her mom's clonidine by accident as it looks similar to her Klonopin that she meant to take for anxiety.  She also notes that she took part of a Suboxone that she got from a friend.  She has used cocaine this week as well but denies any use today.  She states that after taking the medication she became drowsiness some slurred speech and was intermittently having some confusion.  She contacted poison control who instructed her head to the ED.  Poison control was called in the ED and recommend IV fluids, monitoring on telemetry, EKG every 4 hours, atropine if rated cardiac, and monitoring for at least 6 hours with patient need to be asymptomatic for least 3 hours as symptoms can wax and wane.  They state it would be reasonable to monitor patient for up to 24 hours if she remains symptomatic.  As below patient has continued to have soft blood pressures and bradycardia in the ED. As above she does report UTI symptoms consisting of urinary frequency and dysuria. She denies fevers, chills, chest pain, abdominal pain, constipation, diarrhea, nausea, vomiting.   ED Course: Vital signs in the ED significant for blood pressure in the 80s to 100 systolic, heart rate in the 50s.  Vital signs otherwise stable.  CMP within normal limits, CBC within normal limits.  Urinalysis consistent with UTI but does appear to be contaminated with multiple squamous cells.  Patient given a dose of ceftriaxone in the ED as well as 1 L fluid bolus.  She was monitored for  3 or 4 hours and family is more comfortable staying overnight.  Review of Systems: As per HPI otherwise all other systems reviewed and are negative.  Past Medical History:  Diagnosis Date  . Anxiety   . Chronic anemia    secondary to menorrhagia, which prompted hysterectomy in 2017  . Hepatitis C    per patient, has cleared  . Hepatitis C     Past Surgical History:  Procedure Laterality Date  . ABDOMINAL HYSTERECTOMY  2017   with bilateral salpingectomy  . HYSTERECTOMY ABDOMINAL WITH SALPINGECTOMY      Social History  reports that she has been smoking. She has been smoking about 1.00 pack per day. She has never used smokeless tobacco. She reports current alcohol use. She reports previous drug use. Drugs: Cocaine and Marijuana.  No Known Allergies  Family History  Family history unknown: Yes   Prior to Admission medications   Medication Sig Start Date End Date Taking? Authorizing Provider  acetaminophen (TYLENOL) 325 MG tablet Take 2 tablets (650 mg total) by mouth every 6 (six) hours as needed for mild pain or headache. 12/03/18   Juliet Rude, PA-C  traMADol (ULTRAM) 50 MG tablet Take 0.5-1 tablets (25-50 mg total) by mouth every 6 (six) hours as needed for moderate pain or severe pain. 12/03/18   Juliet Rude, PA-C    Physical Exam: Vitals:   04/02/20 2115 04/02/20 2154 04/02/20 2209 04/02/20 2218  BP: (!) 89/62  94/67 100/83  Pulse: Marland Kitchen)  58 (!) 43 61 60  Resp: 18   18  Temp:      TempSrc:      SpO2: 98%   99%  Weight:      Height:       Physical Exam Constitutional:      General: She is not in acute distress.    Appearance: Normal appearance.  HENT:     Head: Normocephalic and atraumatic.     Mouth/Throat:     Mouth: Mucous membranes are moist.     Pharynx: Oropharynx is clear.  Eyes:     Extraocular Movements: Extraocular movements intact.     Pupils: Pupils are equal, round, and reactive to light.  Cardiovascular:     Rate and Rhythm: Normal  rate and regular rhythm.     Pulses: Normal pulses.     Heart sounds: Normal heart sounds.  Pulmonary:     Effort: Pulmonary effort is normal. No respiratory distress.     Breath sounds: Normal breath sounds.  Abdominal:     General: Bowel sounds are normal. There is no distension.     Palpations: Abdomen is soft.     Tenderness: There is no abdominal tenderness.  Musculoskeletal:        General: No swelling or deformity.  Skin:    General: Skin is warm and dry.  Neurological:     General: No focal deficit present.     Mental Status: Mental status is at baseline.    Labs on Admission: I have personally reviewed following labs and imaging studies  CBC: Recent Labs  Lab 04/02/20 1920  WBC 5.8  NEUTROABS 2.4  HGB 12.2  HCT 37.3  MCV 91.6  PLT 240    Basic Metabolic Panel: Recent Labs  Lab 04/02/20 1920  NA 139  K 3.8  CL 105  CO2 27  GLUCOSE 129*  BUN 15  CREATININE 0.70  CALCIUM 8.9    GFR: Estimated Creatinine Clearance: 73 mL/min (by C-G formula based on SCr of 0.7 mg/dL).  Liver Function Tests: Recent Labs  Lab 04/02/20 1920  AST 22  ALT 17  ALKPHOS 48  BILITOT 0.6  PROT 6.8  ALBUMIN 3.5    Urine analysis:    Component Value Date/Time   COLORURINE AMBER (A) 04/02/2020 1920   APPEARANCEUR HAZY (A) 04/02/2020 1920   APPEARANCEUR Clear 02/14/2014 1929   LABSPEC 1.031 (H) 04/02/2020 1920   LABSPEC 1.006 02/14/2014 1929   PHURINE 5.0 04/02/2020 1920   GLUCOSEU 50 (A) 04/02/2020 1920   GLUCOSEU Negative 02/14/2014 1929   HGBUR NEGATIVE 04/02/2020 1920   BILIRUBINUR NEGATIVE 04/02/2020 1920   BILIRUBINUR Negative 02/14/2014 1929   KETONESUR NEGATIVE 04/02/2020 1920   PROTEINUR 100 (A) 04/02/2020 1920   NITRITE POSITIVE (A) 04/02/2020 1920   LEUKOCYTESUR NEGATIVE 04/02/2020 1920   LEUKOCYTESUR Negative 02/14/2014 1929    Radiological Exams on Admission: No results found.  EKG: Not yet obtained.  Assessment/Plan Principal Problem:    Clonidine overdose Active Problems:   Anxiety   Acute lower UTI  Clonidine overdose > As above patient accidentally took 2 of her brothers clonidine was a total of 0.4 mg, after mistaking it for her Klonopin as they do look very similar. > Patient monitored for about 4 hours in the ED and had blood pressures in the 80s to 100 systolic and heart rate in the 50s. > Based on recommendations from poison control will monitor patient overnight on telemetry with our EKGs. - Observe  in MedSurg with telemetry - Every 4 hours EKG - Atropine available as needed x1 for heart rate in the 40s - IV fluids at 125 cc/h  UTI > Patient with UTI symptoms and evidence of UTI on urinalysis although this may be contaminated given squamous cells her symptoms are consistent with UTI.  > States she does have recurrent UTIs with last 1 was several months ago. - Received dose of ceftriaxone in the ED will switch to Macrobid starting tomorrow (based on prior sensitivities)  Anxiety - Holding off on sedating medications given her clonidine overdose  DVT prophylaxis: Lovenox  Code Status:   Full  Family Communication:  Significant other updated at bedside Disposition Plan:   Patient is from:  Home  Anticipated DC to:  Home  Anticipated DC date:  1 day  Anticipated DC barriers: None  Consults called:  None  Admission status:  Observation, MedSurg with telemetry   Severity of Illness: The appropriate patient status for this patient is OBSERVATION. Observation status is judged to be reasonable and necessary in order to provide the required intensity of service to ensure the patient's safety. The patient's presenting symptoms, physical exam findings, and initial radiographic and laboratory data in the context of their medical condition is felt to place them at decreased risk for further clinical deterioration. Furthermore, it is anticipated that the patient will be medically stable for discharge from the hospital  within 2 midnights of admission. The following factors support the patient status of observation.   " The patient's presenting symptoms include drowsiness, slurred speech. " The physical exam findings include stable exam findings, initially bradycardic and hypotensive. " The initial radiographic and laboratory data are stable.Synetta Fail MD [facility]  How to contact the [facility] Attending or Consulting provider 7A - 7P or covering provider during after hours 7P -7A, for this patient?   1. Check the care team in [facility] and look for a) attending/consulting [facility] provider listed and b) the [facility] team listed 2. Log into [email or web address] and use 's universal password to access. If you do not have the password, please contact the hospital operator. 3. Locate the [facility] provider you are looking for under [facility] and page to a number that you can be directly reached. 4. If you still have difficulty reaching the provider, please page the [facility] (Director on Call) for the Hospitalists listed on amion for assistance.  04/02/2020, 11:09 PM

## 2020-04-02 NOTE — ED Notes (Signed)
Poison control called at this time.

## 2020-04-03 ENCOUNTER — Encounter: Payer: Self-pay | Admitting: Internal Medicine

## 2020-04-03 DIAGNOSIS — F419 Anxiety disorder, unspecified: Secondary | ICD-10-CM

## 2020-04-03 LAB — URINE DRUG SCREEN, QUALITATIVE (ARMC ONLY)
Amphetamines, Ur Screen: NOT DETECTED
Barbiturates, Ur Screen: NOT DETECTED
Benzodiazepine, Ur Scrn: POSITIVE — AB
Cannabinoid 50 Ng, Ur ~~LOC~~: NOT DETECTED
Cocaine Metabolite,Ur ~~LOC~~: POSITIVE — AB
MDMA (Ecstasy)Ur Screen: NOT DETECTED
Methadone Scn, Ur: NOT DETECTED
Opiate, Ur Screen: POSITIVE — AB
Phencyclidine (PCP) Ur S: NOT DETECTED
Tricyclic, Ur Screen: POSITIVE — AB

## 2020-04-03 LAB — HIV ANTIBODY (ROUTINE TESTING W REFLEX): HIV Screen 4th Generation wRfx: NONREACTIVE

## 2020-04-03 MED ORDER — IBUPROFEN 400 MG PO TABS
800.0000 mg | ORAL_TABLET | Freq: Three times a day (TID) | ORAL | Status: DC | PRN
  Administered 2020-04-03: 800 mg via ORAL
  Filled 2020-04-03: qty 2

## 2020-04-03 MED ORDER — NITROFURANTOIN MONOHYD MACRO 100 MG PO CAPS
100.0000 mg | ORAL_CAPSULE | Freq: Two times a day (BID) | ORAL | 0 refills | Status: AC
Start: 2020-04-03 — End: 2020-04-08

## 2020-04-03 NOTE — Discharge Instructions (Signed)
Pt advised to establish PCP in the area Abstain from using cocaine,benzos and opioids

## 2020-04-03 NOTE — Progress Notes (Signed)
Nutrition Brief Note  Patient identified on the Malnutrition Screening Tool (MST) Report  45 y.o. female with medical history significant of anxiety, anemia, hepatitis C and cocaine use who presents following accidental overdose at home as well as UTI   Met with pt in room today. Pt reports good appetite and oral intake pta and in hospital.   Wt Readings from Last 15 Encounters:  04/02/20 52.6 kg  02/06/20 64 kg  06/12/19 53.1 kg  12/02/18 59.7 kg  05/22/18 63.5 kg    Body mass index is 19.91 kg/m. Patient meets criteria for normal weight based on current BMI. Nutrition-Focused physical exam completed. Findings are mild fat depletions in orbital regions and arms, mild muscle depletions in temporal regions and clavicles and no edema.    Current diet order is regular, patient is consuming approximately 100% of meals at this time. Labs and medications reviewed.   No nutrition interventions warranted at this time. If nutrition issues arise, please consult RD.   Koleen Distance MS, RD, LDN Please refer to [facility] for RD and/or RD on-call/weekend/after hours pager

## 2020-04-03 NOTE — Discharge Summary (Signed)
[facility] [place] at [facility]   PATIENT NAME: Michelle Garza    MR#:  [id number]  DATE OF BIRTH:  [birth year]  DATE OF ADMISSION:  04/02/2020 ADMITTING PHYSICIAN: Synetta Fail, MD  DATE OF DISCHARGE: 04/03/2020  PRIMARY CARE PHYSICIAN: Patient, No Pcp Per    ADMISSION DIAGNOSIS:  Clonidine overdose, accidental or unintentional, initial encounter [T46.5X1A]  DISCHARGE DIAGNOSIS:  Accidental use of clonidine UTI recurrent  SECONDARY DIAGNOSIS:   Past Medical History:  Diagnosis Date  . Anxiety   . Chronic anemia    secondary to menorrhagia, which prompted hysterectomy in 2017  . Hepatitis C    per patient, has cleared  . Hepatitis C     HOSPITAL COURSE:   Michelle Garza is a 45 y.o. female with medical history significant of anxiety, anemia now status post hysterectomy, hepatitis C, cocaine use who presents following accidental overdose (2 tabs)at home. Patient states that she was caring some anxiety and suffering from UTI today.  She took her mom's clonidine by accident as it looks similar to her Klonopin that she meant to take for anxiety.  Clonidine overdose appears accidental - As above patient accidentally took 2 of her families clonidine was a total of 0.4 mg, after mistaking it for her Klonopin as they do look very similar. -  heart rate in upper 70s. Patient hemodynamically stable - Based on recommendations from poison control will monitor patient overnight on telemetry with our EKGs. -Received IV fluids. -- Denies any suicidal ideation  UTI, recurrent -- to oral Macrobid -- continue hydration orally and cranberry juice  Anxiety chronic polysubstance abuse -patient advised to get primary care physician in the area and follow-up regularly. -- Urine drug screen positive for cocaine, opiates, benzodiazepine. Patient advised to abstain.  Overall feels better. Hemodynamically stable. Will discharge her to home. Patient is in agreement  and requesting to go home  DVT prophylaxis:      Lovenox  Code Status:              Full  Family Communication:    none today Disposition Plan: home today  CONSULTS OBTAINED:    DRUG ALLERGIES:  No Known Allergies  DISCHARGE MEDICATIONS:   Allergies as of 04/03/2020   No Known Allergies     Medication List    STOP taking these medications   acetaminophen 325 MG tablet Commonly known as: TYLENOL   traMADol 50 MG tablet Commonly known as: ULTRAM     TAKE these medications   nitrofurantoin (macrocrystal-monohydrate) 100 MG capsule Commonly known as: MACROBID Take 1 capsule (100 mg total) by mouth every 12 (twelve) hours for 5 days.       If you experience worsening of your admission symptoms, develop shortness of breath, life threatening emergency, suicidal or homicidal thoughts you must seek medical attention immediately by calling 911 or calling your MD immediately  if symptoms less severe.  You Must read complete instructions/literature along with all the possible adverse reactions/side effects for all the Medicines you take and that have been prescribed to you. Take any new Medicines after you have completely understood and accept all the possible adverse reactions/side effects.   Please note  You were cared for by a hospitalist during your hospital stay. If you have any questions about your discharge medications or the care you received while you were in the hospital after you are discharged, you can call the unit and asked to speak with the hospitalist on call  if the hospitalist that took care of you is not available. Once you are discharged, your primary care physician will handle any further medical issues. Please note that NO REFILLS for any discharge medications will be authorized once you are discharged, as it is imperative that you return to your primary care physician (or establish a relationship with a primary care physician if you do not have one) for your  aftercare needs so that they can reassess your need for medications and monitor your lab values. Today   SUBJECTIVE   Patient feels better. She is wanting to go home. Tolerating PO diet. Denies any complaints.  VITAL SIGNS:  Blood pressure 103/76, pulse 60, temperature 98.5 F (36.9 C), resp. rate 18, height 5\' 4"  (1.626 m), weight 52.6 kg, SpO2 100 %.  I/O:    Intake/Output Summary (Last 24 hours) at 04/03/2020 1307 Last data filed at 04/03/2020 1012 Gross per 24 hour  Intake 240 ml  Output -  Net 240 ml    PHYSICAL EXAMINATION:  GENERAL:  45 y.o.-year-old patient lying in the bed with no acute distress.  LUNGS: Normal breath sounds bilaterally, no wheezing, rales,rhonchi or crepitation. No use of accessory muscles of respiration.  CARDIOVASCULAR: S1, S2 normal. No murmurs, rubs, or gallops.  ABDOMEN: Soft, non-tender, non-distended. Bowel sounds present. No organomegaly or mass.  EXTREMITIES: No pedal edema, cyanosis, or clubbing.  NEUROLOGIC: Cranial nerves II through XII are intact. Muscle strength 5/5 in all extremities. Sensation intact. Gait not checked.  PSYCHIATRIC: The patient is alert and oriented x 3. Mild anxiety SKIN: No obvious rash, lesion, or ulcer.   DATA REVIEW:   CBC  Recent Labs  Lab 04/02/20 1920  WBC 5.8  HGB 12.2  HCT 37.3  PLT 240    Chemistries  Recent Labs  Lab 04/02/20 1920  NA 139  K 3.8  CL 105  CO2 27  GLUCOSE 129*  BUN 15  CREATININE 0.70  CALCIUM 8.9  AST 22  ALT 17  ALKPHOS 48  BILITOT 0.6    Microbiology Results   No results found for this or any previous visit (from the past 240 hour(s)).  RADIOLOGY:  No results found.   CODE STATUS:     Code Status Orders  (From admission, onward)         Start     Ordered   04/02/20 2232  Full code  Continuous        04/02/20 2234        Code Status History    Date Active Date Inactive Code Status Order ID Comments User Context   11/29/2018 2335 12/04/2018 1652  Full Code 12/06/2018  [id number], MD ED   Advance Care Planning Activity       TOTAL TIME TAKING CARE OF THIS PATIENT: *35* minutes.    Harriette Bouillon M.D  [facility]    CC: Primary care physician; Patient, No Pcp Per

## 2020-04-03 NOTE — ED Notes (Signed)
Helped pt to toilet. 

## 2020-04-03 NOTE — ED Notes (Signed)
PT refused being covid tested MD notified

## 2020-04-03 NOTE — ED Notes (Signed)
Michelle Garza was messaged via secure chat about this pt at this time

## 2020-07-20 ENCOUNTER — Ambulatory Visit: Payer: Self-pay

## 2020-07-26 ENCOUNTER — Ambulatory Visit: Payer: Self-pay | Admitting: Physician Assistant

## 2020-07-26 ENCOUNTER — Other Ambulatory Visit: Payer: Self-pay

## 2020-07-26 ENCOUNTER — Encounter: Payer: Self-pay | Admitting: Physician Assistant

## 2020-07-26 DIAGNOSIS — A5901 Trichomonal vulvovaginitis: Secondary | ICD-10-CM

## 2020-07-26 DIAGNOSIS — Z113 Encounter for screening for infections with a predominantly sexual mode of transmission: Secondary | ICD-10-CM

## 2020-07-26 LAB — WET PREP FOR TRICH, YEAST, CLUE
Trichomonas Exam: POSITIVE — AB
Yeast Exam: NEGATIVE

## 2020-07-26 MED ORDER — METRONIDAZOLE 500 MG PO TABS: 500.0000 mg | ORAL_TABLET | Freq: Two times a day (BID) | ORAL | 0 refills | Status: AC

## 2020-07-26 NOTE — Progress Notes (Signed)
Wet mount reviewed, pt treated for trich per provider order. Provider order completed.
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
## 2020-08-03 LAB — HM HIV SCREENING LAB: HM HIV Screening: NEGATIVE

## 2020-09-25 ENCOUNTER — Ambulatory Visit: Payer: Self-pay

## 2020-12-16 ENCOUNTER — Emergency Department
Admission: EM | Admit: 2020-12-16 | Discharge: 2020-12-16 | Disposition: A | Discharge status: Home / Self Care | Payer: Self-pay | Attending: Emergency Medicine | Admitting: Emergency Medicine

## 2020-12-16 ENCOUNTER — Emergency Department: Payer: Self-pay

## 2020-12-16 ENCOUNTER — Other Ambulatory Visit: Payer: Self-pay

## 2020-12-16 DIAGNOSIS — Z5321 Procedure and treatment not carried out due to patient leaving prior to being seen by health care provider: Secondary | ICD-10-CM | POA: Insufficient documentation

## 2020-12-16 DIAGNOSIS — R52 Pain, unspecified: Secondary | ICD-10-CM

## 2020-12-16 DIAGNOSIS — F1490 Cocaine use, unspecified, uncomplicated: Secondary | ICD-10-CM | POA: Insufficient documentation

## 2020-12-16 DIAGNOSIS — R079 Chest pain, unspecified: Secondary | ICD-10-CM | POA: Insufficient documentation

## 2020-12-16 DIAGNOSIS — R0602 Shortness of breath: Secondary | ICD-10-CM | POA: Insufficient documentation

## 2020-12-16 LAB — TROPONIN I (HIGH SENSITIVITY): Troponin I (High Sensitivity): 4 ng/L (ref <18)

## 2020-12-16 LAB — BASIC METABOLIC PANEL
Anion gap: 8 (ref 5–15)
BUN: 16 mg/dL (ref 6–20)
CO2: 26 mmol/L (ref 22–32)
Calcium: 9.4 mg/dL (ref 8.9–10.3)
Chloride: 103 mmol/L (ref 98–111)
Creatinine, Ser: 0.81 mg/dL (ref 0.44–1.00)
GFR, Estimated: >60 mL/min (ref >60)
Glucose, Bld: 97 mg/dL (ref 70–99)
Potassium: 3.2 mmol/L — ABNORMAL LOW (ref 3.5–5.1)
Sodium: 137 mmol/L (ref 135–145)

## 2020-12-16 LAB — CBC
HCT: 39.9 % (ref 36.0–46.0)
Hemoglobin: 14 g/dL (ref 12.0–15.0)
MCH: 31.7 pg (ref 26.0–34.0)
MCHC: 35.1 g/dL (ref 30.0–36.0)
MCV: 90.3 fL (ref 80.0–100.0)
Platelets: 263 10*3/uL (ref 150–400)
RBC: 4.42 MIL/uL (ref 3.87–5.11)
RDW: 12.2 % (ref 11.5–15.5)
WBC: 5.2 10*3/uL (ref 4.0–10.5)
nRBC: 0 % (ref 0.0–0.2)

## 2020-12-16 NOTE — ED Triage Notes (Signed)
Pt in with chest pain and sob after cocaine use earlier tonight. States she also took some sort of BP med, which she thought was a Norco tab. No other reported ingestion or drug use endorsed.

## 2020-12-16 NOTE — ED Notes (Signed)
Rainbow sent to the lab.  

## 2021-07-10 ENCOUNTER — Ambulatory Visit: Payer: Self-pay

## 2021-12-27 ENCOUNTER — Emergency Department
Admission: EM | Admit: 2021-12-27 | Discharge: 2021-12-27 | Discharge status: Against Medical Advice | Payer: Self-pay | Attending: Emergency Medicine | Admitting: Emergency Medicine

## 2021-12-27 ENCOUNTER — Encounter: Payer: Self-pay | Admitting: Emergency Medicine

## 2021-12-27 ENCOUNTER — Other Ambulatory Visit: Payer: Self-pay

## 2021-12-27 DIAGNOSIS — R200 Anesthesia of skin: Secondary | ICD-10-CM | POA: Insufficient documentation

## 2021-12-27 DIAGNOSIS — Z5321 Procedure and treatment not carried out due to patient leaving prior to being seen by health care provider: Secondary | ICD-10-CM | POA: Diagnosis not present

## 2021-12-27 NOTE — ED Notes (Signed)
Pt visualized walking outside. Security and charge RN approached pt outside to advise her that she needed to come back inside. Pt states that she is going to get something to eat. Charge RN informed pt that if she left we would have to take her off the board. Pt stated that that's was ok that she thought she was just having anxiety and she wanted to go get food.

## 2021-12-27 NOTE — ED Triage Notes (Signed)
Patient arrives ambulatory by POV stating she snorting something from a friend this morning. Patient believes that it was meth but unsure. Patient reports feeling sick intermittently through the day. Reports numbness to hands. Patient restless in triage stating "please help me, my brain feels like I'm having a stroke."

## 2022-08-07 DIAGNOSIS — N898 Other specified noninflammatory disorders of vagina: Secondary | ICD-10-CM | POA: Diagnosis not present

## 2022-08-07 DIAGNOSIS — N390 Urinary tract infection, site not specified: Secondary | ICD-10-CM | POA: Diagnosis not present

## 2022-08-07 DIAGNOSIS — N12 Tubulo-interstitial nephritis, not specified as acute or chronic: Secondary | ICD-10-CM | POA: Diagnosis not present

## 2022-08-07 DIAGNOSIS — Z113 Encounter for screening for infections with a predominantly sexual mode of transmission: Secondary | ICD-10-CM | POA: Diagnosis not present

## 2022-08-22 ENCOUNTER — Ambulatory Visit: Payer: Self-pay

## 2022-08-27 ENCOUNTER — Emergency Department
Admission: EM | Admit: 2022-08-27 | Discharge: 2022-08-27 | Discharge status: Against Medical Advice | Payer: Self-pay | Attending: Emergency Medicine | Admitting: Emergency Medicine

## 2022-08-27 ENCOUNTER — Other Ambulatory Visit: Payer: Self-pay

## 2022-08-27 DIAGNOSIS — Z5321 Procedure and treatment not carried out due to patient leaving prior to being seen by health care provider: Secondary | ICD-10-CM | POA: Diagnosis not present

## 2022-08-27 DIAGNOSIS — L02611 Cutaneous abscess of right foot: Secondary | ICD-10-CM | POA: Insufficient documentation

## 2022-08-27 DIAGNOSIS — L089 Local infection of the skin and subcutaneous tissue, unspecified: Secondary | ICD-10-CM | POA: Insufficient documentation

## 2022-08-27 LAB — COMPREHENSIVE METABOLIC PANEL
ALT: 20 U/L (ref 0–44)
AST: 30 U/L (ref 15–41)
Albumin: 3.7 g/dL (ref 3.5–5.0)
Alkaline Phosphatase: 62 U/L (ref 38–126)
Anion gap: 11 (ref 5–15)
BUN: 13 mg/dL (ref 6–20)
CO2: 27 mmol/L (ref 22–32)
Calcium: 8.9 mg/dL (ref 8.9–10.3)
Chloride: 97 mmol/L — ABNORMAL LOW (ref 98–111)
Creatinine, Ser: 0.71 mg/dL (ref 0.44–1.00)
GFR, Estimated: >60 mL/min (ref >60)
Glucose, Bld: 114 mg/dL — ABNORMAL HIGH (ref 70–99)
Potassium: 3.3 mmol/L — ABNORMAL LOW (ref 3.5–5.1)
Sodium: 135 mmol/L (ref 135–145)
Total Bilirubin: 0.7 mg/dL (ref 0.3–1.2)
Total Protein: 8.5 g/dL — ABNORMAL HIGH (ref 6.5–8.1)

## 2022-08-27 LAB — URINE DRUG SCREEN, QUALITATIVE (ARMC ONLY)
Amphetamines, Ur Screen: NOT DETECTED
Barbiturates, Ur Screen: NOT DETECTED
Benzodiazepine, Ur Scrn: POSITIVE — AB
Cannabinoid 50 Ng, Ur ~~LOC~~: POSITIVE — AB
Cocaine Metabolite,Ur ~~LOC~~: POSITIVE — AB
MDMA (Ecstasy)Ur Screen: NOT DETECTED
Methadone Scn, Ur: NOT DETECTED
Opiate, Ur Screen: NOT DETECTED
Phencyclidine (PCP) Ur S: NOT DETECTED
Tricyclic, Ur Screen: NOT DETECTED

## 2022-08-27 LAB — CBC WITH DIFFERENTIAL/PLATELET
Abs Immature Granulocytes: 0.02 10*3/uL (ref 0.00–0.07)
Basophils Absolute: 0 10*3/uL (ref 0.0–0.1)
Basophils Relative: 0 %
Eosinophils Absolute: 0 10*3/uL (ref 0.0–0.5)
Eosinophils Relative: 0 %
HCT: 35.1 % — ABNORMAL LOW (ref 36.0–46.0)
Hemoglobin: 12 g/dL (ref 12.0–15.0)
Immature Granulocytes: 0 %
Lymphocytes Relative: 20 %
Lymphs Abs: 1.3 10*3/uL (ref 0.7–4.0)
MCH: 30.2 pg (ref 26.0–34.0)
MCHC: 34.2 g/dL (ref 30.0–36.0)
MCV: 88.2 fL (ref 80.0–100.0)
Monocytes Absolute: 0.2 10*3/uL (ref 0.1–1.0)
Monocytes Relative: 2 %
Neutro Abs: 5 10*3/uL (ref 1.7–7.7)
Neutrophils Relative %: 78 %
Platelets: 405 10*3/uL — ABNORMAL HIGH (ref 150–400)
RBC: 3.98 MIL/uL (ref 3.87–5.11)
RDW: 11.3 % — ABNORMAL LOW (ref 11.5–15.5)
WBC: 6.6 10*3/uL (ref 4.0–10.5)
nRBC: 0 % (ref 0.0–0.2)

## 2022-08-27 LAB — URINALYSIS, ROUTINE W REFLEX MICROSCOPIC
Bacteria, UA: NONE SEEN
Bilirubin Urine: NEGATIVE
Glucose, UA: NEGATIVE mg/dL
Hgb urine dipstick: NEGATIVE
Ketones, ur: 5 mg/dL — AB
Nitrite: NEGATIVE
Protein, ur: 100 mg/dL — AB
Specific Gravity, Urine: 1.029 (ref 1.005–1.030)
pH: 5 (ref 5.0–8.0)

## 2022-08-27 LAB — LACTIC ACID, PLASMA: Lactic Acid, Venous: 1.2 mmol/L (ref 0.5–1.9)

## 2022-08-27 LAB — CULTURE, BLOOD (ROUTINE X 2): Special Requests: ADEQUATE

## 2022-08-27 NOTE — ED Triage Notes (Signed)
Pt presents to ER with c/o skin infection/abscess to right foot, right hand and right forearm.  Pt admits to using some IV heroin 7/21 and states she used in the three areas aforementioned.  Pt denies any fever or chills at home.  Pt states she recently finished cefdinir and doxycycline appx 4-5 days ago for UTI.  Pt is otherwise A&O x4 and in NAD in triage.

## 2022-08-28 LAB — CULTURE, BLOOD (ROUTINE X 2): Culture: NO GROWTH

## 2022-08-29 LAB — CULTURE, BLOOD (ROUTINE X 2)

## 2023-02-18 ENCOUNTER — Ambulatory Visit: Payer: Self-pay

## 2023-08-19 ENCOUNTER — Ambulatory Visit: Payer: Self-pay

## 2023-08-19 DIAGNOSIS — Z113 Encounter for screening for infections with a predominantly sexual mode of transmission: Secondary | ICD-10-CM

## 2023-08-19 LAB — WET PREP FOR TRICH, YEAST, CLUE
Clue Cell Exam: POSITIVE — AB
Trichomonas Exam: NEGATIVE
Yeast Exam: NEGATIVE
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
## 2023-08-19 NOTE — Progress Notes (Signed)
 Pt is here for STD screening, declined waiting for wet prep results.  Will call if positive. Larraine JONELLE Northern, RN

## 2023-08-22 ENCOUNTER — Telehealth: Payer: Self-pay | Admitting: Family Medicine

## 2023-08-24 LAB — GONOCOCCUS CULTURE

## 2023-08-29 NOTE — Telephone Encounter (Signed)
 See encounter

## 2023-09-25 ENCOUNTER — Telehealth: Payer: Self-pay | Admitting: Family Medicine
